# Patient Record
Sex: Male | Born: 1980 | Race: Black or African American | Hispanic: No | Marital: Married | Smoking: Never smoker
Health system: Southern US, Community
[De-identification: ages and names within clinical notes are randomized; demographics above are authoritative.]

## PROBLEM LIST (undated history)

## (undated) ENCOUNTER — Emergency Department (HOSPITAL_COMMUNITY): Admission: EM | Payer: BC Managed Care – PPO | Source: Home / Self Care

## (undated) DIAGNOSIS — J45909 Unspecified asthma, uncomplicated: Secondary | ICD-10-CM

## (undated) HISTORY — PX: THUMB ARTHROSCOPY: SHX2509

---

## 2006-05-21 ENCOUNTER — Emergency Department (HOSPITAL_COMMUNITY): Admission: EM | Admit: 2006-05-21 | Discharge: 2006-05-21 | Payer: Self-pay | Admitting: Internal Medicine

## 2007-07-17 ENCOUNTER — Emergency Department (HOSPITAL_COMMUNITY): Admission: EM | Admit: 2007-07-17 | Discharge: 2007-07-17 | Payer: Self-pay | Admitting: Emergency Medicine

## 2007-07-24 ENCOUNTER — Inpatient Hospital Stay (HOSPITAL_COMMUNITY): Admission: EM | Admit: 2007-07-24 | Discharge: 2007-07-26 | Payer: Self-pay | Admitting: Emergency Medicine

## 2008-08-22 ENCOUNTER — Emergency Department (HOSPITAL_COMMUNITY): Admission: EM | Admit: 2008-08-22 | Discharge: 2008-08-22 | Payer: Self-pay | Admitting: Emergency Medicine

## 2009-06-22 ENCOUNTER — Emergency Department (HOSPITAL_COMMUNITY): Admission: EM | Admit: 2009-06-22 | Discharge: 2009-06-22 | Payer: Self-pay | Admitting: Family Medicine

## 2010-01-12 ENCOUNTER — Ambulatory Visit: Payer: Self-pay | Admitting: Internal Medicine

## 2010-01-12 DIAGNOSIS — G4700 Insomnia, unspecified: Secondary | ICD-10-CM

## 2010-01-12 DIAGNOSIS — G4730 Sleep apnea, unspecified: Secondary | ICD-10-CM | POA: Insufficient documentation

## 2010-01-12 LAB — CONVERTED CEMR LAB
AST: 22 units/L (ref 0–37)
BUN: 13 mg/dL (ref 6–23)
Basophils Relative: 0.5 % (ref 0.0–3.0)
CO2: 28 meq/L (ref 19–32)
Chloride: 106 meq/L (ref 96–112)
Eosinophils Absolute: 0.3 10*3/uL (ref 0.0–0.7)
GFR calc non Af Amer: 95.2 mL/min (ref >60)
Lymphs Abs: 1.3 10*3/uL (ref 0.7–4.0)
MCV: 86.2 fL (ref 78.0–100.0)
Monocytes Absolute: 0.5 10*3/uL (ref 0.1–1.0)
Monocytes Relative: 8.9 % (ref 3.0–12.0)
Neutro Abs: 3.2 10*3/uL (ref 1.4–7.7)
Neutrophils Relative %: 60.3 % (ref 43.0–77.0)
Potassium: 4.1 meq/L (ref 3.5–5.1)
Sodium: 141 meq/L (ref 135–145)
Total Protein: 6.9 g/dL (ref 6.0–8.3)
WBC: 5.3 10*3/uL (ref 4.5–10.5)

## 2010-01-17 ENCOUNTER — Encounter (INDEPENDENT_AMBULATORY_CARE_PROVIDER_SITE_OTHER): Payer: Self-pay | Admitting: *Deleted

## 2010-02-05 ENCOUNTER — Telehealth: Payer: Self-pay | Admitting: Pulmonary Disease

## 2010-02-09 ENCOUNTER — Encounter: Payer: Self-pay | Admitting: Internal Medicine

## 2010-02-09 ENCOUNTER — Ambulatory Visit: Payer: Self-pay | Admitting: Internal Medicine

## 2010-02-09 LAB — CONVERTED CEMR LAB
Cholesterol: 173 mg/dL (ref 0–200)
HDL: 38.4 mg/dL — ABNORMAL LOW (ref >39.00)
LDL Cholesterol: 112 mg/dL — ABNORMAL HIGH (ref 0–99)
Total CHOL/HDL Ratio: 5

## 2010-08-14 NOTE — Assessment & Plan Note (Signed)
Summary: PHYSICAL---STC   Vital Signs:  Patient profile:   30 year old male Height:      69 inches Weight:      285 pounds O2 Sat:      96 % Temp:     98.4 degrees F oral Pulse rate:   80 / minute Pulse rhythm:   regular Resp:     16 per minute BP sitting:   112 / 70  (left arm)  Vitals Entered By: Rock Nephew CMA (February 09, 2010 1:20 PM)  Primary Care Provider:  Etta Grandchild MD   History of Present Illness: He returns for a complete physical with no complaints.  Preventive Screening-Counseling & Management  Alcohol-Tobacco     Alcohol drinks/day: <1     Alcohol type: beer     >5/day in last 3 mos: no     Alcohol Counseling: not indicated; use of alcohol is not excessive or problematic     Feels need to cut down: no     Feels annoyed by complaints: no     Feels guilty re: drinking: no     Needs 'eye opener' in am: no     Smoking Status: never  Caffeine-Diet-Exercise     Does Patient Exercise: no     Exercise Counseling: to improve exercise regimen  Hep-HIV-STD-Contraception     Hepatitis Risk: no risk noted     HIV Risk: no risk noted     STD Risk: no risk noted  Safety-Violence-Falls     Seat Belt Use: yes     Helmet Use: no     Firearms in the Home: no firearms in the home     Smoke Detectors: no     Violence in the Home: no risk noted     Sexual Abuse: no      Sexual History:  currently monogamous.        Drug Use:  no.        Blood Transfusions:  no.    Clinical Review Panels:  Diabetes Management   Creatinine:  1.2 (01/12/2010)  CBC   WBC:  5.3 (01/12/2010)   RBC:  4.88 (01/12/2010)   Hgb:  14.2 (01/12/2010)   Hct:  42.0 (01/12/2010)   Platelets:  206.0 (01/12/2010)   MCV  86.2 (01/12/2010)   MCHC  33.8 (01/12/2010)   RDW  13.9 (01/12/2010)   PMN:  60.3 (01/12/2010)   Lymphs:  24.7 (01/12/2010)   Monos:  8.9 (01/12/2010)   Eosinophils:  5.6 (01/12/2010)   Basophil:  0.5 (01/12/2010)  Complete Metabolic Panel   Glucose:  86  (01/12/2010)   Sodium:  141 (01/12/2010)   Potassium:  4.1 (01/12/2010)   Chloride:  106 (01/12/2010)   CO2:  28 (01/12/2010)   BUN:  13 (01/12/2010)   Creatinine:  1.2 (01/12/2010)   Albumin:  3.8 (01/12/2010)   Total Protein:  6.9 (01/12/2010)   Calcium:  9.1 (01/12/2010)   Total Bili:  0.7 (01/12/2010)   Alk Phos:  53 (01/12/2010)   SGPT (ALT):  20 (01/12/2010)   SGOT (AST):  22 (01/12/2010)   Medications Prior to Update: 1)  Silenor 6 Mg Tabs (Doxepin Hcl) .... One By Mouth At Bedtime As Needed For Insomnia  Current Medications (verified): 1)  Silenor 6 Mg Tabs (Doxepin Hcl) .... One By Mouth At Bedtime As Needed For Insomnia  Allergies (verified): No Known Drug Allergies  Past History:  Past Medical History: Last updated: 01/12/2010 Unremarkable  Past Surgical History:  Last updated: 01/12/2010 left thumb was surgically repaired in 2009 after a degloving injury at work- he thinks this is when insomnia started  Family History: Last updated: 01/12/2010 Family History of Arthritis Family History Diabetes 1st degree relative  Social History: Last updated: 01/12/2010 Occupation: welder Married Never Smoked Alcohol use-yes Drug use-no Regular exercise-no  Risk Factors: Alcohol Use: <1 (02/09/2010) >5 drinks/d w/in last 3 months: no (02/09/2010) Exercise: no (02/09/2010)  Risk Factors: Smoking Status: never (02/09/2010)  Family History: Reviewed history from 01/12/2010 and no changes required. Family History of Arthritis Family History Diabetes 1st degree relative  Social History: Reviewed history from 01/12/2010 and no changes required. Occupation: welder Married Never Smoked Alcohol use-yes Drug use-no Regular exercise-no Risk analyst Use:  yes  Review of Systems  The patient denies anorexia, fever, weight loss, weight gain, chest pain, syncope, dyspnea on exertion, peripheral edema, prolonged cough, headaches, hemoptysis, abdominal pain,  hematuria, suspicious skin lesions, enlarged lymph nodes, angioedema, and testicular masses.    Physical Exam  General:  alert, well-developed, well-nourished, well-hydrated, appropriate dress, normal appearance, healthy-appearing, cooperative to examination, good hygiene, and overweight-appearing.   Head:  normocephalic, atraumatic, no abnormalities observed, and no abnormalities palpated.   Mouth:  Oral mucosa and oropharynx without lesions or exudates.  Teeth in good repair. Neck:  supple, full ROM, no masses, no thyromegaly, no thyroid nodules or tenderness, no JVD, normal carotid upstroke, no carotid bruits, no cervical lymphadenopathy, and no neck tenderness.   Lungs:  normal respiratory effort, no intercostal retractions, no accessory muscle use, normal breath sounds, no dullness, no fremitus, no crackles, and no wheezes.   Heart:  normal rate, regular rhythm, no murmur, no gallop, no rub, and no JVD.   Abdomen:  soft, non-tender, normal bowel sounds, no distention, no masses, no guarding, no rigidity, no rebound tenderness, no abdominal hernia, no hepatomegaly, and no splenomegaly.   Genitalia:  circumcised, no hydrocele, no varicocele, no scrotal masses, no testicular masses or atrophy, no cutaneous lesions, and no urethral discharge.   Msk:  normal ROM, no joint tenderness, no joint swelling, no joint warmth, no redness over joints, no joint deformities, no joint instability, no crepitation, and no muscle atrophy.   Pulses:  R and L carotid,radial,femoral,dorsalis pedis and posterior tibial pulses are full and equal bilaterally Extremities:  No clubbing, cyanosis, edema, or deformity noted with normal full range of motion of all joints.   Neurologic:  No cranial nerve deficits noted. Station and gait are normal. Plantar reflexes are down-going bilaterally. DTRs are symmetrical throughout. Sensory, motor and coordinative functions appear intact. Skin:  Intact without suspicious lesions or  rashes and tattoo(s).   Cervical Nodes:  no anterior cervical adenopathy and no posterior cervical adenopathy.   Axillary Nodes:  no R axillary adenopathy and no L axillary adenopathy.   Inguinal Nodes:  no R inguinal adenopathy and no L inguinal adenopathy.   Psych:  Cognition and judgment appear intact. Alert and cooperative with normal attention span and concentration. No apparent delusions, illusions, hallucinations   Impression & Recommendations:  Problem # 1:  ROUTINE GENERAL MEDICAL EXAM@HEALTH  CARE FACL (ICD-V70.0) Assessment New  Orders: Venipuncture (16109) TLB-Lipid Panel (80061-LIPID) EKG w/ Interpretation (93000)  Td Booster: Historical (07/16/2007)   TSH: 3.56 (01/12/2010)    Discussed using sunscreen, use of alcohol, drug use, self testicular exam, routine dental care, routine eye care, routine physical exam, seat belts, multiple vitamins, and recommendations for immunizations.  Discussed exercise and checking cholesterol.  Discussed gun  safety, safe sex, and contraception.   Complete Medication List: 1)  Silenor 6 Mg Tabs (Doxepin hcl) .... One by mouth at bedtime as needed for insomnia  Patient Instructions: 1)  Please schedule a follow-up appointment as needed. 2)  It is important that you exercise regularly at least 20 minutes 5 times a week. If you develop chest pain, have severe difficulty breathing, or feel very tired , stop exercising immediately and seek medical attention. 3)  You need to lose weight. Consider a lower calorie diet and regular exercise.  4)  If you could be exposed to sexually transmitted diseases, you should use a condom.

## 2010-08-14 NOTE — Letter (Signed)
Summary: Lipid Letter  [facility]  [address] [place], [place] [address]   Phone: [phone]  Fax: [phone]    02/09/2010  Charles Hoffman [address] [place], [place]  [address]  Dear Charles Hoffman:  We have carefully reviewed your last lipid profile from 02/09/2010 and the results are noted below with a summary of recommendations for lipid management.    Cholesterol:       173     Goal: <200   HDL "good" Cholesterol:   27.25     Goal: >40   LDL "bad" Cholesterol:   112     Goal: <130   Triglycerides:       114.0     Goal: <150        TLC Diet (Therapeutic Lifestyle Change): Saturated Fats & Transfatty acids should be kept < 7% of total calories ***Reduce Saturated Fats Polyunstaurated Fat can be up to 10% of total calories Monounsaturated Fat Fat can be up to 20% of total calories Total Fat should be no greater than 25-35% of total calories Carbohydrates should be 50-60% of total calories Protein should be approximately 15% of total calories Fiber should be at least 20-30 grams a day ***Increased fiber may help lower LDL Total Cholesterol should be < 200mg /day Consider adding plant stanol/sterols to diet (example: Benacol spread) ***A higher intake of unsaturated fat may reduce Triglycerides and Increase HDL    Adjunctive Measures (may lower LIPIDS and reduce risk of Heart Attack) include: Aerobic Exercise (20-30 minutes 3-4 times a week) Limit Alcohol Consumption Weight Reduction Aspirin 75-81 mg a day by mouth (if not allergic or contraindicated) Dietary Fiber 20-30 grams a day by mouth     Current Medications: 1)    Silenor 6 Mg Tabs (Doxepin hcl) .... One by mouth at bedtime as needed for insomnia  If you have any questions, please call. We appreciate being able to work with you.   Sincerely,    [facility] Charles Grandchild MD

## 2010-08-14 NOTE — Letter (Signed)
Summary: [facility] Consult Scheduled Letter  [facility]  [address] [place], [place] [address]   Phone: [phone]  Fax: [phone]      01/17/2010 MRN: [id number]  Charles Hoffman [address] [place], [place]  [address]    Dear [name],      We have scheduled an appointment for you.  At the recommendation of Dr.Jones we have scheduled you a consult with ([facility]) DR.Vassie Loll on July 22,2011 at 3:30PM.Their phone number is [phone].If this appointment day and time is not convenient for you, please feel free to call the office of the doctor you are being referred to at the number listed above and reschedule the appointment.    Thank you,  Patient Care Coordinator [facility]

## 2010-08-14 NOTE — Progress Notes (Signed)
Summary: nos appt  Phone Note Call from Patient   Caller: [email or web address]  Call For: alva Summary of Call: ATC pt to rsc nos from 7/22, non working number. Initial call taken by: Darletta Moll,  February 05, 2010 9:52 AM

## 2010-08-14 NOTE — Assessment & Plan Note (Signed)
Summary: new / bcbs/cd   Vital Signs:  Patient profile:   30 year old male Height:      69 inches (175.26 cm) Weight:      288 pounds (130.91 kg) BMI:     42.68 O2 Sat:      98 % on Room air Temp:     98.1 degrees F (36.72 degrees C) oral Pulse rate:   74 / minute Pulse rhythm:   regular Resp:     16 per minute BP sitting:   100 / 78  (left arm) Cuff size:   large  Vitals Entered By: Lanier Prude, CMA(AAMA) (January 12, 2010 10:52 AM)  Nutrition Counseling: Patient's BMI is greater than 25 and therefore counseled on weight management options.  O2 Flow:  Room air CC: New pt to est PCP. C/O insomnia, Insomnia Is Patient Diabetic? No Pain Assessment Patient in pain? no        Primary Care Provider:  Etta Grandchild MD  CC:  New pt to est PCP. C/O insomnia and Insomnia.  History of Present Illness:  Insomnia      This is a 30 year old man who presents with Insomnia.  The symptoms began >1 year ago.  The severity is described as moderate.  The patient reports difficulty falling asleep, frequent awakening, early awakening, snoring, and apnea noted by partner, but denies nightmares, leg movements, and daytime somnolence.  Associated symptoms include weight gain.  Insomnia has led to problems with falling asleep at work, memory loss, and impaired judgement.  Risk factors for insomnia include obesity, working third shift, and irregular work schedule.  Behaviors that may contribute to insomnia include OTC sleep aids.  Treatments tried in the past and found to be ineffective incude sedatives.    Preventive Screening-Counseling & Management  Alcohol-Tobacco     Alcohol drinks/day: <1     Alcohol type: beer     >5/day in last 3 mos: no     Alcohol Counseling: not indicated; use of alcohol is not excessive or problematic     Feels need to cut down: no     Feels annoyed by complaints: no     Feels guilty re: drinking: no     Needs 'eye opener' in am: no     Smoking Status:  never  Caffeine-Diet-Exercise     Does Patient Exercise: no     Exercise Counseling: to improve exercise regimen  Hep-HIV-STD-Contraception     Hepatitis Risk: no risk noted     HIV Risk: no risk noted     STD Risk: no risk noted      Sexual History:  currently monogamous.        Drug Use:  no.        Blood Transfusions:  no.    Medications Prior to Update: 1)  None  Current Medications (verified): 1)  Silenor 6 Mg Tabs (Doxepin Hcl) .... 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  The patient denies anorexia, fever, chest pain, syncope, dyspnea on exertion, peripheral edema, prolonged cough, headaches, hemoptysis, abdominal pain, hematuria, difficulty walking, depression, enlarged lymph nodes, and angioedema.    Physical Exam  General:  alert, well-developed, well-nourished, well-hydrated, appropriate dress, normal appearance, healthy-appearing, cooperative to examination, good hygiene, and overweight-appearing.   Head:  normocephalic, atraumatic, no abnormalities observed, and no abnormalities palpated.   Eyes:  vision grossly intact and pupils equal.   Mouth:  Oral mucosa and oropharynx without lesions or exudates.  Teeth in good repair. Neck:  supple, full  ROM, no masses, no thyromegaly, no thyroid nodules or tenderness, no JVD, normal carotid upstroke, no carotid bruits, no cervical lymphadenopathy, and no neck tenderness.   Lungs:  normal respiratory effort, no intercostal retractions, no accessory muscle use, normal breath sounds, no dullness, no fremitus, no crackles, and no wheezes.   Heart:  normal rate, regular rhythm, no murmur, no gallop, no rub, and no JVD.   Abdomen:  soft, non-tender, normal bowel sounds, no distention, no masses, no guarding, no rigidity, no rebound tenderness, no abdominal hernia, no hepatomegaly, and no splenomegaly.   Msk:  No deformity or scoliosis noted of thoracic or lumbar spine.   Pulses:  R and L carotid,radial,femoral,dorsalis pedis and posterior tibial pulses are full and equal bilaterally Extremities:  No clubbing, cyanosis, edema, or deformity noted with normal full range of motion of all joints.   Neurologic:  No cranial nerve deficits noted. Station and gait are normal. Plantar reflexes are down-going bilaterally. DTRs are symmetrical throughout. Sensory, motor and coordinative functions appear intact. Skin:  Intact without suspicious lesions or rashes Cervical Nodes:  no anterior cervical adenopathy and no posterior cervical adenopathy.   Axillary Nodes:  no R axillary adenopathy and no L axillary adenopathy.   Psych:  Cognition and judgment appear intact. Alert and cooperative with normal attention span and concentration. No apparent delusions, illusions, hallucinations   Impression & Recommendations:  Problem # 1:  INSOMNIA-SLEEP DISORDER-UNSPEC (ICD-780.52) Assessment New  His updated medication list for this problem includes:    Silenor 6 Mg Tabs (Doxepin hcl) ..... One by mouth at bedtime as needed for insomnia  Orders: Venipuncture (81191) TLB-BMP (Basic Metabolic Panel-BMET) (80048-METABOL) TLB-CBC Platelet - w/Differential (85025-CBCD) TLB-Hepatic/Liver Function Pnl  (80076-HEPATIC) TLB-TSH (Thyroid Stimulating Hormone) (84443-TSH) Sleep Disorder Referral (Sleep Disorder)  Discussed sleep hygiene.   Problem # 2:  SLEEP APNEA (ICD-780.57) Assessment: New  Orders: Venipuncture (47829) TLB-BMP (Basic Metabolic Panel-BMET) (80048-METABOL) TLB-CBC Platelet - w/Differential (85025-CBCD) TLB-Hepatic/Liver Function Pnl (80076-HEPATIC) TLB-TSH (Thyroid Stimulating Hormone) (84443-TSH) Sleep Disorder Referral (Sleep Disorder)  Complete Medication List: 1)  Silenor 6 Mg Tabs (Doxepin hcl) .... One by mouth at bedtime as needed for insomnia  Patient Instructions: 1)  Please schedule a follow-up appointment in 2 months. 2)  It is important that you exercise regularly at least 20 minutes 5 times a week. If you develop chest pain, have severe difficulty breathing, or feel very tired , stop exercising immediately and seek medical attention. 3)  You need to lose weight. Consider a lower calorie diet and regular exercise.  Prescriptions: SILENOR 6 MG TABS (DOXEPIN HCL) One by mouth at bedtime as needed for insomnia  #40 x 0   Entered and Authorized by:   Charles Fus L.  Yetta Barre MD   Signed by:   Charles Grandchild MD on 01/12/2010   Method used:   Samples Given   RxID:   [id number]    Immunization History:  Tetanus/Td Immunization History:    Tetanus/Td:  historical (07/16/2007)

## 2010-09-07 IMAGING — CR DG CHEST 2V
2 series · 2 of 2 positions shown · non-contrast
Comparison: 08/22/2008

CLINICAL DATA: Cough.  Congestion.  Wheezing.

CHEST - 2 VIEW

[w chest pa]
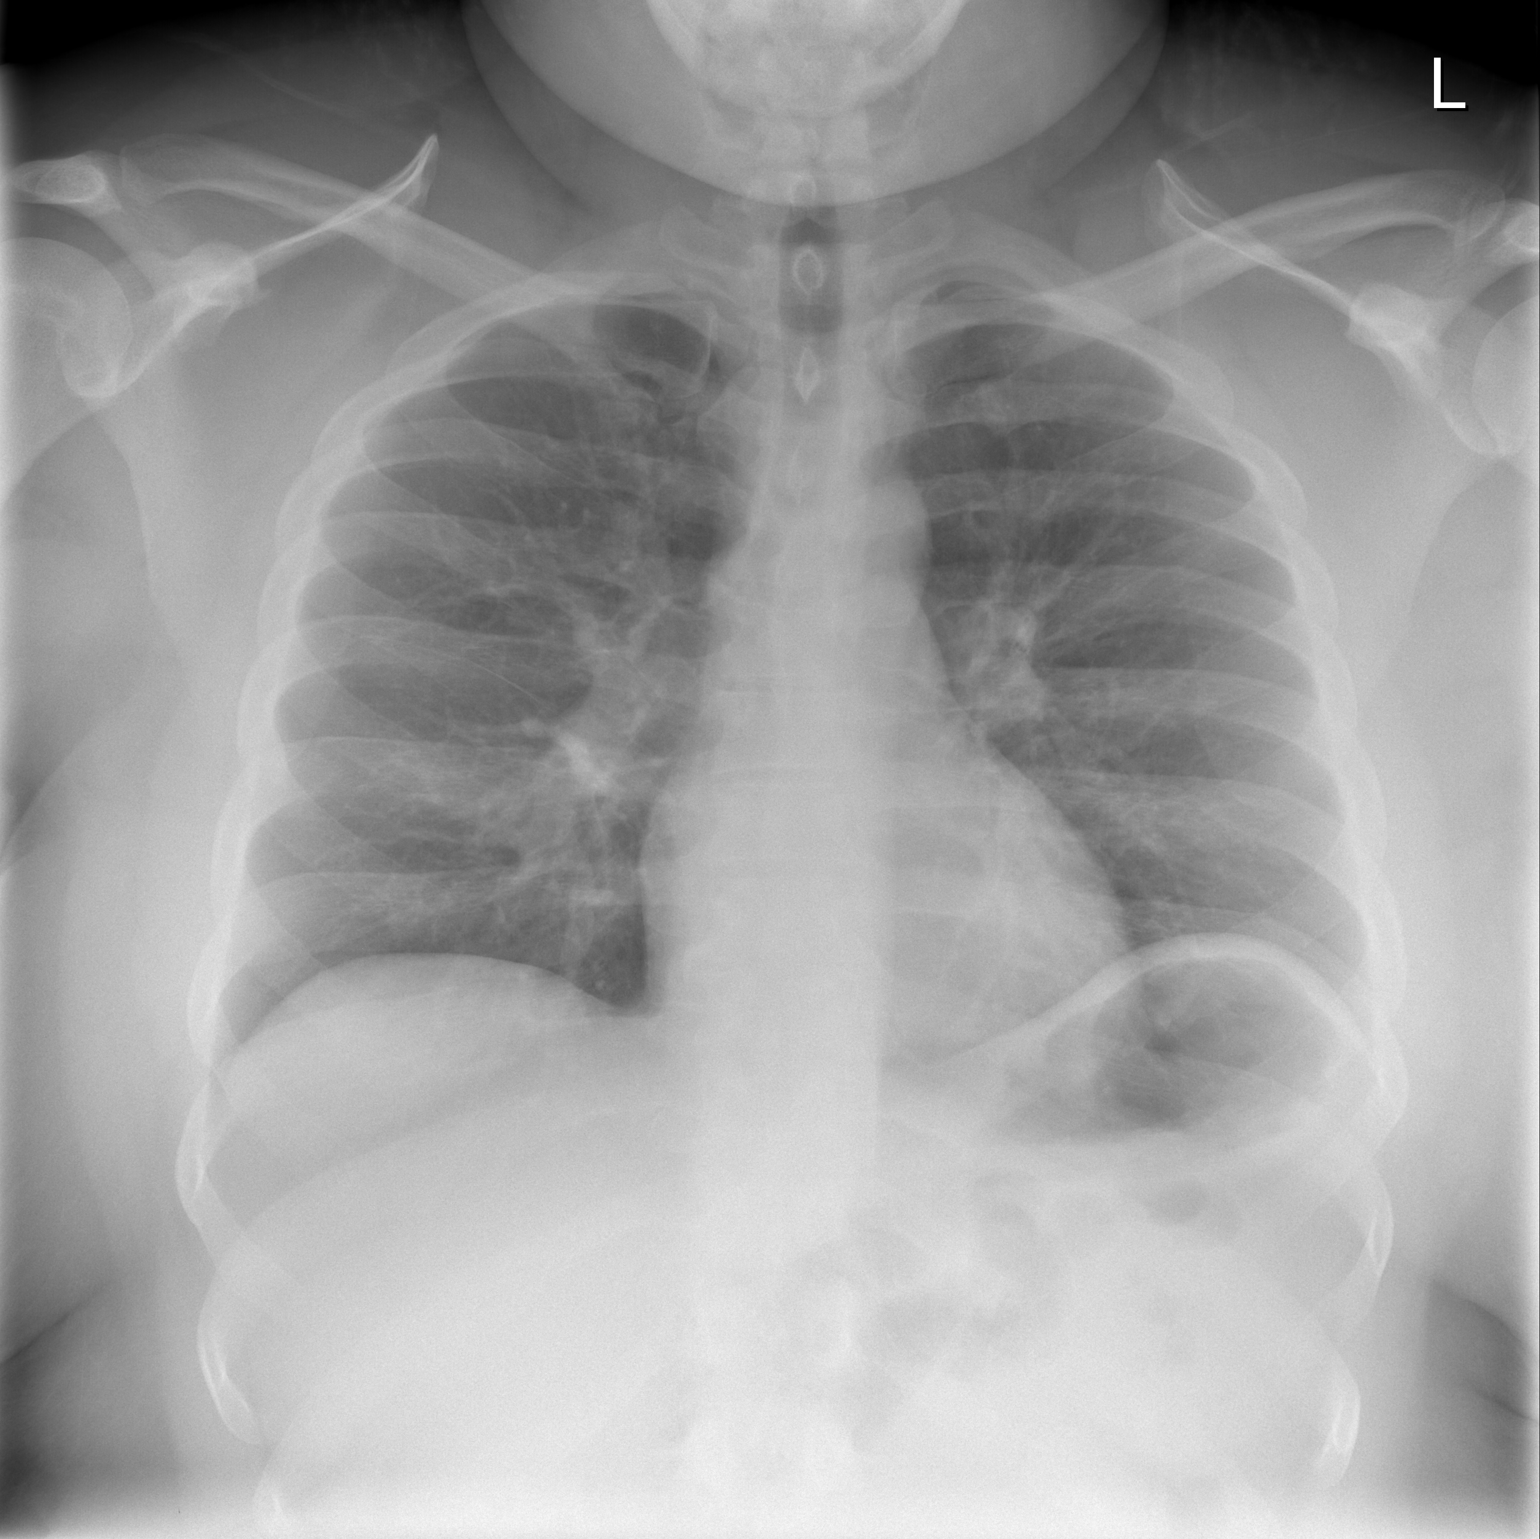

[w chest lat]
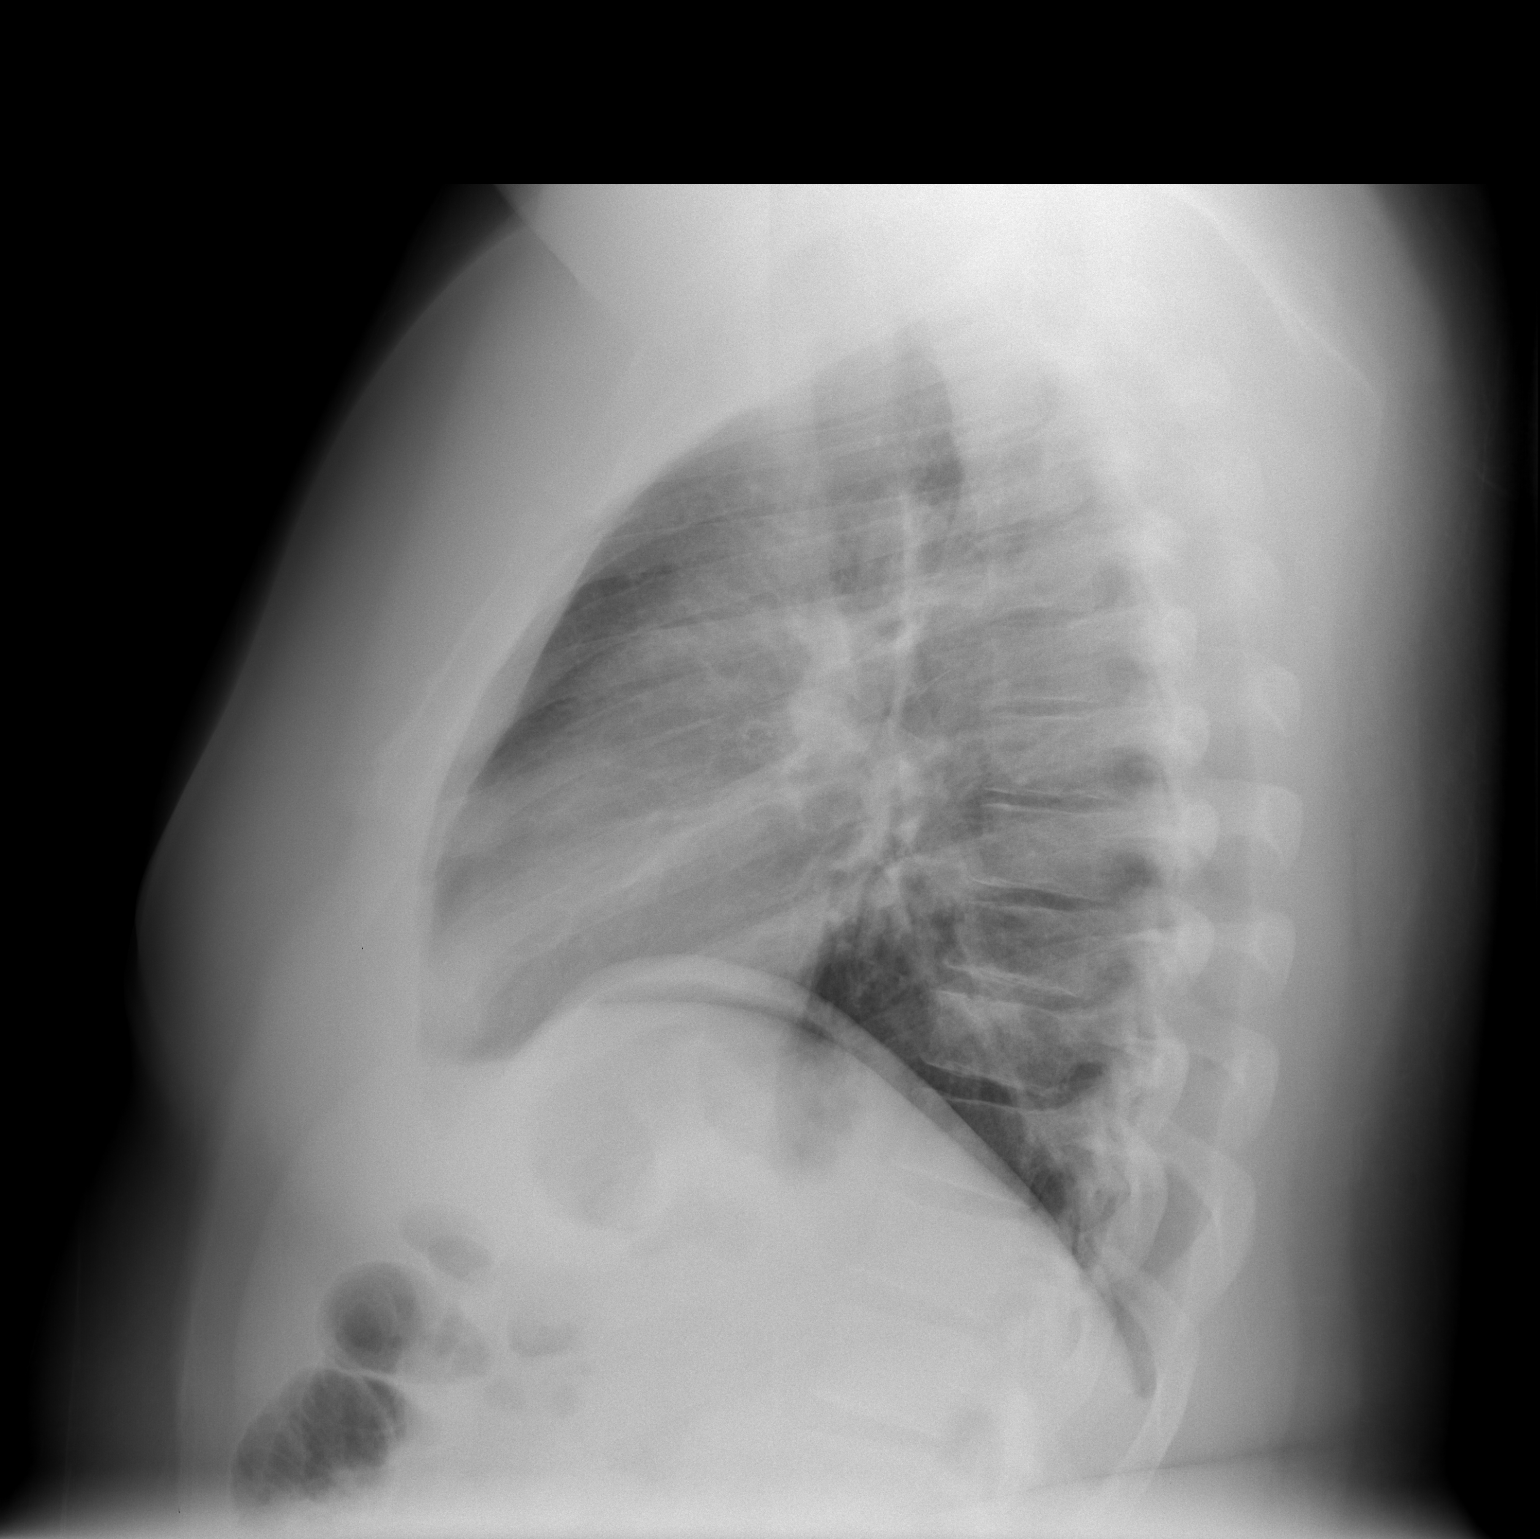

[2 of 2 positions shown; findings below may reference images not displayed]

FINDINGS: Peribronchial thickening as noted previously.  No active
infiltrate.  No atelectasis.  Normal cardiomediastinal silhouette.
Bony thorax intact.
IMPRESSION: Chronic bronchitic changes.  No infiltrate/atelectasis.

## 2010-11-27 NOTE — Op Note (Signed)
Charles Hoffman, Charles Hoffman                 ACCOUNT NO.:  [id number]   MEDICAL RECORD NO.:  [id number]          PATIENT TYPE:  INP   LOCATION:  1606                         FACILITY:  [facility]   PHYSICIAN:  Dionne Ano. Gramig III, M.D.DATE OF BIRTH:  1980-11-30   DATE OF PROCEDURE:  07/24/2007  DATE OF DISCHARGE:                               OPERATIVE REPORT   PREOPERATIVE DIAGNOSIS:  Severe avulsive/degloving injury left thumb  with distal phalanx fracture, nailbed laceration and disarray as well as  severe soft tissue injury involving the tendon, tendon sheath, and bony  structures, of course.   POSTOPERATIVE DIAGNOSIS:  Severe avulsive/degloving injury left thumb  with distal phalanx fracture, nailbed laceration and disarray as well as  severe soft tissue injury involving the tendon, tendon sheath, and bony  structures, of course.   PROCEDURE:  1. I&D of left thumb skin, subcutaneous tissue, bone, muscle, and      tendon tissue.  This was an excisional debridement.  2. Open reduction and internal fixation distal phalanx fracture left      thumb.  3. Neurolysis, extensive in nature radial digital nerve and ulnar      digital nerve left thumb.  Both of these nerves were extensively      evaluated and underwent a neurolysis.  4. Volar flap advancement left thumb.  5. Nail plate removal left thumb.  6. Nail bed repair left thumb.  7. Stress radiography left upper extremity.  8. Complex laceration 5 cm in length left hand.   SURGEON:  Dominica Severin   ASSISTANT:  Karie Chimera   COMPLICATIONS:  None.   ANESTHESIA:  General.   TOURNIQUET TIME:  0.   ESTIMATED BLOOD LOSS:  Minimal.   INDICATIONS FOR PROCEDURE:  This patient is a pleasant male who presents  with the above-mentioned diagnosis.  I have counseled him in regard to  risks and benefits of this surgery including risk of infection,  bleeding, anesthesia, damage to normal structures, and failure of the  surgery to accomplish  its intended goals of relieving symptoms and  restoring function.  With this in mind, he desires to proceed.  All  questions have been encouraged and answered preoperatively.   OPERATIVE PROCEDURE:  The patient seen by myself and anesthesia.  He was  counseled in regards to the viability issues about his thumb.  Arm was  marked.  Consent was signed.  Preoperative Ancef was given, and tetanus  shot was addressed in the emergency room.  He was taken to the operative  suite and underwent a smooth induction of general anesthesia and then  underwent a prep and drape about the left upper extremity.  He had a  severe mangling and degloving injury about the thumb with open distal  phalanx fracture, nail bed disarray, and lysed serratia.  This was  stellate laceration that began dorsoradially and swung ulnarly to the  web space.  I identified this at length.  I should note his flexor  tendon sheath and oblique pulley were completely disrupted in the area  of the wound with lots of mechanical  debris in the area.  He had an open  fracture, and this was an extension of the degloving injury ulnarly  which coursed along the ulnar margin of the thumb.  The distal phalanx  fracture was open.  I performed an I&D of skin, subcutaneous tissue,  tendon, paratenon tissue, muscle, and bone.  Greater than 4-5 liters of  saline were placed through the area, and meticulously we picked out the  debris and nonviable tissue.  This was done with the tourniquet  deflated.   Following this, we performed open reduction internal fixation of the  distal phalanx fracture by placing a Kirschner wire 0.035 through the  tip of the thumb.  I was able to achieve excellent fixation and anatomic  reduction.  This was clipped and bent upon itself.   Following this, we underwent neurolysis of the radial and ulnar digital  nerves that were encased in mechanical debris and nonviable tissue.  These were traced out and were stable.   The patient had a radial digital  nerve avulsive injury with segmental loss which was noted as well.  Following the neurolysis, attention was turned towards the thumb tip  once again where a nail plate removal had previously been accomplished  atraumatically with the Therapist, nutritional.  The nailbed at this time was  repaired with 6-0 chromic suture including the sterile and germinal  matrix.  This was a very stellate laceration and I would give the nail  growth a guarded yet somewhat variable prognosis in terms of regrowth  but would expect some degree of undulation in the nail itself.   Following this, I then performed a volar advancement flap about the  thumb.  The patient had a Moberg type advancement flap accomplished.  This was based upon the blood flow characteristics of the flaps in  general, and this was advanced slightly.  There were no complicating  features with the advancement, and it was sutured loosely with chromic  suture.   Following this, I performed complex hand repair along 5 cm in the web  space and the dorsal margin of thumb.  This was closed loosely with  chromic suture.   The patient tolerated this well.  At the conclusion of the procedure, he  did have refill to the tip of his thumb; however, I would give this a  somewhat guarded prognosis at this juncture.  He was warmed copiously  with saline of the warm variety in the operative suite.  In addition to  this, I will plan for precautions postoperatively including Dextran L&D  40, aspirin, and warming techniques.  I have discussed with the patient  and his family these issues the do's and don't's etc.   We will continue a very close and cautious eye on the patient.  He will  need precautions for quite some time and generally prolonged recovery.  This was a very significant mangling degloving injury and certainly  prognosis for the future is somewhat guarded at this juncture.  Hopefully, he will maintain vascular  integrity and viability to the  thumb will be maintained.  It was a pleasure to participate in his care,  and I look forward to participating in his operative recovery.           ______________________________  Dionne Ano. Everlene Other, M.D.    Nash Mantis  D:  07/24/2007  T:  07/25/2007  Job:  [id number]

## 2011-01-23 ENCOUNTER — Emergency Department (HOSPITAL_COMMUNITY)
Admission: EM | Admit: 2011-01-23 | Discharge: 2011-01-23 | Disposition: A | Discharge status: Home / Self Care | Payer: BC Managed Care – PPO | Attending: Emergency Medicine | Admitting: Emergency Medicine

## 2011-01-23 DIAGNOSIS — K029 Dental caries, unspecified: Secondary | ICD-10-CM | POA: Insufficient documentation

## 2011-01-23 DIAGNOSIS — K089 Disorder of teeth and supporting structures, unspecified: Secondary | ICD-10-CM | POA: Insufficient documentation

## 2011-04-04 LAB — COMPREHENSIVE METABOLIC PANEL
AST: 28
Albumin: 3.4 — ABNORMAL LOW
BUN: 5 — ABNORMAL LOW
CO2: 27
Chloride: 102
Creatinine, Ser: 1.25
Glucose, Bld: 120 — ABNORMAL HIGH
Sodium: 136
Total Protein: 6

## 2011-04-04 LAB — APTT: aPTT: 25

## 2011-04-04 LAB — PROTIME-INR
INR: 0.9
Prothrombin Time: 12.7

## 2011-04-04 LAB — DIFFERENTIAL
Basophils Absolute: 0
Basophils Relative: 1
Monocytes Relative: 8

## 2012-02-29 ENCOUNTER — Encounter (HOSPITAL_COMMUNITY): Payer: Self-pay | Admitting: Emergency Medicine

## 2012-02-29 ENCOUNTER — Emergency Department (HOSPITAL_COMMUNITY)
Admission: EM | Admit: 2012-02-29 | Discharge: 2012-02-29 | Disposition: A | Discharge status: Home / Self Care | Payer: Self-pay | Attending: Emergency Medicine | Admitting: Emergency Medicine

## 2012-02-29 DIAGNOSIS — R059 Cough, unspecified: Secondary | ICD-10-CM | POA: Insufficient documentation

## 2012-02-29 DIAGNOSIS — J40 Bronchitis, not specified as acute or chronic: Secondary | ICD-10-CM | POA: Insufficient documentation

## 2012-02-29 DIAGNOSIS — R05 Cough: Secondary | ICD-10-CM | POA: Insufficient documentation

## 2012-02-29 HISTORY — DX: Unspecified asthma, uncomplicated: J45.909

## 2012-02-29 MED ORDER — AZITHROMYCIN 250 MG PO TABS: ORAL_TABLET | ORAL | Status: DC

## 2012-02-29 NOTE — ED Provider Notes (Signed)
History     CSN: [id number]  Arrival date & time 02/29/12  4098   First MD Initiated Contact with Patient 02/29/12 [phone]      Chief Complaint  Patient presents with  . Cough    (Consider location/radiation/quality/duration/timing/severity/associated sxs/prior treatment) HPI  Patient reports for the past 1-1/2 weeks he's been having a cough with initially green sputum which is now white. Two days in the past week he was awakened from sleep with sweats. He states he has some low back pain like a pulling sensation when he coughs. Also states he is starting to lose his voice. He states at night he has wheezing and he has an inhaler that he uses for that. He states he has a sore throat when he coughs but he denies shortness of breath. He also has a feeling that he has postnasal drip. He states he's had this past 3 years he normally gets better with a Z-Pak.  PCP none  Past Medical History  Diagnosis Date  . Asthma     Past Surgical History  Procedure Date  . Thumb arthroscopy     History reviewed. No pertinent family history.  History  Substance Use Topics  . Smoking status: Never Smoker   . Smokeless tobacco: Not on file  . Alcohol Use: No  employed as a Psychologist, occupational Lives with wife  wife smokes  Review of Systems  All other systems reviewed and are negative.    Allergies  Review of patient's allergies indicates no known allergies.  Home Medications   Current Outpatient Rx  Name Route Sig Dispense Refill  . DIPHENHYDRAMINE HCL 25 MG PO CAPS Oral Take 25 mg by mouth every 6 (six) hours as needed. allergies      BP 132/67  Pulse 85  Temp 98.6 F (37 C) (Oral)  Resp 20  SpO2 97%  Vital signs normal    Physical Exam  Nursing note and vitals reviewed. Constitutional: He is oriented to person, place, and time. He appears well-developed and well-nourished.  Non-toxic appearance. He does not appear ill. No distress.  HENT:  Head: Normocephalic and atraumatic.    Right Ear: External ear normal.  Left Ear: External ear normal.  Nose: Nose normal. No mucosal edema or rhinorrhea.  Mouth/Throat: Oropharynx is clear and moist and mucous membranes are normal. No dental abscesses or uvula swelling.       Patient has mild laryngitis  Eyes: Conjunctivae and EOM are normal. Pupils are equal, round, and reactive to light.  Neck: Normal range of motion and full passive range of motion without pain. Neck supple.  Cardiovascular: Normal rate, regular rhythm and normal heart sounds.  Exam reveals no gallop and no friction rub.   No murmur heard. Pulmonary/Chest: Effort normal and breath sounds normal. No respiratory distress. He has no wheezes. He has no rhonchi. He has no rales. He exhibits no tenderness and no crepitus.       Patient's coughing and it sounds dry  Abdominal: Soft. Normal appearance and bowel sounds are normal. He exhibits no distension. There is no tenderness. There is no rebound and no guarding.  Musculoskeletal: Normal range of motion. He exhibits no edema and no tenderness.       Moves all extremities well.   Neurological: He is alert and oriented to person, place, and time. He has normal strength. No cranial nerve deficit.  Skin: Skin is warm, dry and intact. No rash noted. No erythema. No pallor.  Psychiatric: He has  a normal mood and affect. His speech is normal and behavior is normal. His mood appears not anxious.    ED Course  Procedures (including critical care time)  Patient states he thinks he only needs a Z-Pak. He did not want any other interventions or studies to be done. He states he doesn't need another inhaler.     1. Bronchitis    New Prescriptions   AZITHROMYCIN (ZITHROMAX Z-PAK) 250 MG TABLET    Take 2 po the first day then once a day for the next 4 days.   Plan discharge  Devoria Albe, MD, FACEP    MDM          Ward Givens, MD 02/29/12 [phone]

## 2012-02-29 NOTE — ED Notes (Signed)
Patient states that he has had a upper respiratory cough with cold symptoms x 1 week

## 2012-03-06 DIAGNOSIS — J45909 Unspecified asthma, uncomplicated: Secondary | ICD-10-CM | POA: Insufficient documentation

## 2012-03-06 DIAGNOSIS — K047 Periapical abscess without sinus: Secondary | ICD-10-CM | POA: Insufficient documentation

## 2012-03-07 ENCOUNTER — Emergency Department (HOSPITAL_COMMUNITY)
Admission: EM | Admit: 2012-03-07 | Discharge: 2012-03-07 | Disposition: A | Discharge status: Home / Self Care | Payer: Self-pay | Attending: Emergency Medicine | Admitting: Emergency Medicine

## 2012-03-07 ENCOUNTER — Encounter (HOSPITAL_COMMUNITY): Payer: Self-pay | Admitting: Emergency Medicine

## 2012-03-07 DIAGNOSIS — K047 Periapical abscess without sinus: Secondary | ICD-10-CM

## 2012-03-07 MED ORDER — PENICILLIN V POTASSIUM 500 MG PO TABS: 500.0000 mg | ORAL_TABLET | Freq: Four times a day (QID) | ORAL | Status: AC

## 2012-03-07 MED ORDER — BUPIVACAINE-EPINEPHRINE PF 0.5-1:200000 % IJ SOLN
INTRAMUSCULAR | Status: AC
  Filled 2012-03-07: qty 1.8

## 2012-03-07 MED ORDER — OXYCODONE-ACETAMINOPHEN 5-325 MG PO TABS: 1.0000 | ORAL_TABLET | ORAL | Status: AC | PRN

## 2012-03-07 MED ORDER — BUPIVACAINE HCL (PF) 0.5 % IJ SOLN
INTRAMUSCULAR | Status: AC
  Administered 2012-03-07: 02:00:00
  Filled 2012-03-07: qty 30

## 2012-03-07 NOTE — ED Notes (Signed)
PA at bedside.

## 2012-03-07 NOTE — ED Provider Notes (Signed)
History     CSN: [id number]  Arrival date & time 03/06/12  2302   First MD Initiated Contact with Patient 03/07/12 0200      Chief Complaint  Patient presents with  . Dental Pain    (Consider location/radiation/quality/duration/timing/severity/associated sxs/prior treatment) Patient is a 31 y.o. male presenting with tooth pain. The history is provided by the patient.  Dental PainThe primary symptoms include mouth pain. Primary symptoms do not include oral bleeding, headaches or fever. The symptoms began 3 to 5 days ago. The symptoms are worsening. The symptoms are new. The symptoms occur constantly.  Pt states tooth has been broken for few years. Pain and now swelling to the gum. Has not seen a dentist.  Past Medical History  Diagnosis Date  . Asthma     Past Surgical History  Procedure Date  . Thumb arthroscopy     Family History  Problem Relation Age of Onset  . Diabetes Other     History  Substance Use Topics  . Smoking status: Never Smoker   . Smokeless tobacco: Not on file  . Alcohol Use: No      Review of Systems  Constitutional: Negative for fever and chills.  HENT: Positive for dental problem.   Respiratory: Negative.   Cardiovascular: Negative.   Musculoskeletal: Negative.   Skin: Negative.   Neurological: Negative for headaches.  Hematological: Negative.   Psychiatric/Behavioral: Negative.     Allergies  Review of patient's allergies indicates no known allergies.  Home Medications   Current Outpatient Rx  Name Route Sig Dispense Refill  . DIPHENHYDRAMINE HCL 25 MG PO CAPS Oral Take 25 mg by mouth every 6 (six) hours as needed. allergies      BP 130/74  Pulse 79  Temp 98.8 F (37.1 C) (Oral)  Resp 18  SpO2 99%  Physical Exam  Nursing note and vitals reviewed. Constitutional: He is oriented to person, place, and time. He appears well-developed and well-nourished. No distress.  HENT:       Left lower bicuspid and 1st molar decayed to  the gum line. surrouding abscess, tender to palpation  Eyes: Conjunctivae are normal. Pupils are equal, round, and reactive to light.  Neck: Neck supple.  Cardiovascular: Normal rate, regular rhythm and normal heart sounds.   Pulmonary/Chest: Effort normal and breath sounds normal. No respiratory distress. He has no wheezes.  Musculoskeletal: Normal range of motion. He exhibits no edema.  Neurological: He is alert and oriented to person, place, and time.  Skin: Skin is warm and dry.  Psychiatric: He has a normal mood and affect.    ED Course  Procedures (including critical care time)  Pt with dental abscess and tooth decay. I used lower alveolar block to numb pt's tooth. I&D performed with moderate puss out. Will d/c home with close outpatient oral surgery follow up. Antibiotics and pain meds at home.    1. Dental abscess       MDM          Lottie Mussel, PA 03/07/12 [phone]

## 2012-03-07 NOTE — ED Notes (Signed)
Pt states he has an abscessed tooth on the bottom left   Pt states pain started about Wednesday

## 2012-03-12 NOTE — ED Provider Notes (Signed)
Medical screening examination/treatment/procedure(s) were performed by non-physician practitioner and as supervising physician I was immediately available for consultation/collaboration.   Miosha Behe M Percival Glasheen, DO 03/12/12 1548 

## 2013-07-28 ENCOUNTER — Encounter (HOSPITAL_COMMUNITY): Payer: Self-pay | Admitting: Emergency Medicine

## 2013-07-28 ENCOUNTER — Emergency Department (HOSPITAL_COMMUNITY)
Admission: EM | Admit: 2013-07-28 | Discharge: 2013-07-29 | Disposition: A | Discharge status: Home / Self Care | Payer: BC Managed Care – PPO | Attending: Emergency Medicine | Admitting: Emergency Medicine

## 2013-07-28 DIAGNOSIS — J45909 Unspecified asthma, uncomplicated: Secondary | ICD-10-CM | POA: Insufficient documentation

## 2013-07-28 DIAGNOSIS — K5289 Other specified noninfective gastroenteritis and colitis: Secondary | ICD-10-CM | POA: Insufficient documentation

## 2013-07-28 DIAGNOSIS — K529 Noninfective gastroenteritis and colitis, unspecified: Secondary | ICD-10-CM

## 2013-07-28 DIAGNOSIS — R197 Diarrhea, unspecified: Secondary | ICD-10-CM | POA: Insufficient documentation

## 2013-07-28 LAB — CBC WITH DIFFERENTIAL/PLATELET
BASOS ABS: 0 10*3/uL (ref 0.0–0.1)
BASOS PCT: 0 % (ref 0–1)
EOS ABS: 0.1 10*3/uL (ref 0.0–0.7)
EOS PCT: 3 % (ref 0–5)
HEMATOCRIT: 44.7 % (ref 39.0–52.0)
Hemoglobin: 15.5 g/dL (ref 13.0–17.0)
LYMPHS ABS: 0.9 10*3/uL (ref 0.7–4.0)
LYMPHS PCT: 23 % (ref 12–46)
MCH: 29.3 pg (ref 26.0–34.0)
MCHC: 34.7 g/dL (ref 30.0–36.0)
MCV: 84.5 fL (ref 78.0–100.0)
Monocytes Absolute: 0.3 10*3/uL (ref 0.1–1.0)
Monocytes Relative: 8 % (ref 3–12)
NEUTROS ABS: 2.7 10*3/uL (ref 1.7–7.7)
Neutrophils Relative %: 66 % (ref 43–77)
PLATELETS: 192 10*3/uL (ref 150–400)
RBC: 5.29 MIL/uL (ref 4.22–5.81)
RDW: 13.1 % (ref 11.5–15.5)
WBC: 4.1 10*3/uL (ref 4.0–10.5)

## 2013-07-28 LAB — COMPREHENSIVE METABOLIC PANEL
ALBUMIN: 3.7 g/dL (ref 3.5–5.2)
ALT: 20 U/L (ref 0–53)
AST: 25 U/L (ref 0–37)
Alkaline Phosphatase: 55 U/L (ref 39–117)
BUN: 13 mg/dL (ref 6–23)
CHLORIDE: 102 meq/L (ref 96–112)
CO2: 24 meq/L (ref 19–32)
Calcium: 8.7 mg/dL (ref 8.4–10.5)
Creatinine, Ser: 1.24 mg/dL (ref 0.50–1.35)
GFR calc Af Amer: 86 mL/min — ABNORMAL LOW (ref >90)
GFR calc non Af Amer: 75 mL/min — ABNORMAL LOW (ref >90)
GLUCOSE: 98 mg/dL (ref 70–99)
Potassium: 4.1 mEq/L (ref 3.7–5.3)
SODIUM: 137 meq/L (ref 137–147)
Total Bilirubin: 0.4 mg/dL (ref 0.3–1.2)
Total Protein: 7.5 g/dL (ref 6.0–8.3)

## 2013-07-28 MED ORDER — ONDANSETRON 8 MG PO TBDP
8.0000 mg | ORAL_TABLET | Freq: Once | ORAL | Status: AC
  Administered 2013-07-28: 8 mg via ORAL
  Filled 2013-07-28: qty 1

## 2013-07-28 MED ORDER — DICYCLOMINE HCL 20 MG PO TABS
20.0000 mg | ORAL_TABLET | Freq: Once | ORAL | Status: AC
  Administered 2013-07-28: 20 mg via ORAL
  Filled 2013-07-28: qty 1

## 2013-07-28 NOTE — ED Notes (Signed)
Pt states he woke up Monday with abd pain  Pt states he has not been able to eat for the past couple days  Pt states he has had diarrhea since then and it has progressively gotten darker in color and now it looks black  Pt states he has been feeling weak  Denies vomiting

## 2013-07-28 NOTE — ED Provider Notes (Signed)
CSN: [id number]     Arrival date & time 07/28/13  2241 History   First MD Initiated Contact with Patient 07/28/13 2301     Chief Complaint  Patient presents with  . Abdominal Pain   (Consider location/radiation/quality/duration/timing/severity/associated sxs/prior Treatment) HPI 33 year old male presents to emergency room with complaint of nausea, abdominal discomfort, diarrhea.  Symptoms started 3 days ago.  No fever, no vomiting.  He's been taking Pepto-Bismol without improvement in symptoms.  No specific pain, or of a general cramping, and discomfort.  No prior abdominal surgeries.  No significant medical history.  No unusual foods, no travel, no sick contacts. Past Medical History  Diagnosis Date  . Asthma    Past Surgical History  Procedure Laterality Date  . Thumb arthroscopy     Family History  Problem Relation Age of Onset  . Diabetes Other    History  Substance Use Topics  . Smoking status: Never Smoker   . Smokeless tobacco: Not on file  . Alcohol Use: No    Review of Systems  See History of Present Illness; otherwise all other systems are reviewed and negative Allergies  Review of patient's allergies indicates no known allergies.  Home Medications   Current Outpatient Rx  Name  Route  Sig  Dispense  Refill  . bismuth subsalicylate (PEPTO BISMOL) 262 MG/15ML suspension   Oral   Take 30 mLs by mouth every 6 (six) hours as needed (indigestion).          BP 127/77  Pulse 78  Temp(Src) 98.8 F (37.1 C) (Oral)  Resp 15  Ht 5\' 7"  (1.702 m)  Wt 280 lb (127.007 kg)  BMI 43.84 kg/m2  SpO2 97% Physical Exam  Nursing note and vitals reviewed. Constitutional: He is oriented to person, place, and time. He appears well-developed and well-nourished.  HENT:  Head: Normocephalic and atraumatic.  Right Ear: External ear normal.  Left Ear: External ear normal.  Nose: Nose normal.  Mouth/Throat: Oropharynx is clear and moist.  Eyes: Conjunctivae and EOM are  normal. Pupils are equal, round, and reactive to light.  Neck: Normal range of motion. Neck supple. No JVD present. No tracheal deviation present. No thyromegaly present.  Cardiovascular: Normal rate, regular rhythm, normal heart sounds and intact distal pulses.  Exam reveals no gallop and no friction rub.   No murmur heard. Pulmonary/Chest: Effort normal and breath sounds normal. No stridor. No respiratory distress. He has no wheezes. He has no rales. He exhibits no tenderness.  Abdominal: Soft. He exhibits no distension and no mass. There is no tenderness. There is no rebound and no guarding.  Hyperactive bowel sounds  Musculoskeletal: Normal range of motion. He exhibits no edema and no tenderness.  Lymphadenopathy:    He has no cervical adenopathy.  Neurological: He is alert and oriented to person, place, and time. He exhibits normal muscle tone. Coordination normal.  Skin: Skin is warm and dry. No rash noted. No erythema. No pallor.  Psychiatric: He has a normal mood and affect. His behavior is normal. Judgment and thought content normal.    ED Course  Procedures (including critical care time) Labs Review Labs Reviewed  COMPREHENSIVE METABOLIC PANEL - Abnormal; Notable for the following:    GFR calc non Af Amer 75 (*)    GFR calc Af Amer 86 (*)    All other components within normal limits  URINALYSIS, ROUTINE W REFLEX MICROSCOPIC - Abnormal; Notable for the following:    Color, Urine AMBER (*)  Bilirubin Urine SMALL (*)    All other components within normal limits  CBC WITH DIFFERENTIAL  OCCULT BLOOD, POC DEVICE   Imaging Review No results found.  EKG Interpretation   None       MDM   1. Gastroenteritis    33 year old male with 3 days of abdominal discomfort, diarrhea, nausea.  Plan for Zofran, Bentyl.  We'll check labs.  12:58 AM Pt feeling better, has tolerated PO fluids.  Labs unremarkable.  Suspect gastroenteritis.    Olivia Mackie, MD 07/29/13 0100

## 2013-07-28 NOTE — ED Notes (Signed)
Pt is aware of the need for urine, Urine cup at bedside.

## 2013-07-29 LAB — URINALYSIS, ROUTINE W REFLEX MICROSCOPIC
Glucose, UA: NEGATIVE mg/dL
HGB URINE DIPSTICK: NEGATIVE
KETONES UR: NEGATIVE mg/dL
Leukocytes, UA: NEGATIVE
NITRITE: NEGATIVE
PH: 5.5 (ref 5.0–8.0)
PROTEIN: NEGATIVE mg/dL
SPECIFIC GRAVITY, URINE: 1.03 (ref 1.005–1.030)
UROBILINOGEN UA: 0.2 mg/dL (ref 0.0–1.0)

## 2013-07-29 LAB — OCCULT BLOOD, POC DEVICE: Fecal Occult Bld: NEGATIVE

## 2013-07-29 MED ORDER — ONDANSETRON 8 MG PO TBDP: 8.0000 mg | ORAL_TABLET | Freq: Three times a day (TID) | ORAL | Status: DC | PRN

## 2013-07-29 MED ORDER — DICYCLOMINE HCL 20 MG PO TABS: 20.0000 mg | ORAL_TABLET | Freq: Four times a day (QID) | ORAL | Status: DC | PRN

## 2013-07-29 NOTE — Discharge Instructions (Signed)
Diet for Diarrhea, Adult °Frequent, runny stools (diarrhea) may be caused or worsened by food or drink. Diarrhea may be relieved by changing your diet. Since diarrhea can last up to 7 days, it is easy for you to lose too much fluid from the body and become dehydrated. Fluids that are lost need to be replaced. Along with a modified diet, make sure you drink enough fluids to keep your urine clear or pale yellow. °DIET INSTRUCTIONS °· Ensure adequate fluid intake (hydration): have 1 cup (8 oz) of fluid for each diarrhea episode. Avoid fluids that contain simple sugars or sports drinks, fruit juices, whole milk products, and sodas. Your urine should be clear or pale yellow if you are drinking enough fluids. Hydrate with an oral rehydration solution that you can purchase at pharmacies, retail stores, and online. You can prepare an oral rehydration solution at home by mixing the following ingredients together: °·   tsp table salt. °· ¾ tsp baking soda. °·  tsp salt substitute containing potassium chloride. °· 1  tablespoons sugar. °· 1 L (34 oz) of water. °· Certain foods and beverages may increase the speed at which food moves through the gastrointestinal (GI) tract. These foods and beverages should be avoided and include: °· Caffeinated and alcoholic beverages. °· High-fiber foods, such as raw fruits and vegetables, nuts, seeds, and whole grain breads and cereals. °· Foods and beverages sweetened with sugar alcohols, such as xylitol, sorbitol, and mannitol. °· Some foods may be well tolerated and may help thicken stool including: °· Starchy foods, such as rice, toast, pasta, low-sugar cereal, oatmeal, grits, baked potatoes, crackers, and bagels.   °· Bananas.   °· Applesauce. °· Add probiotic-rich foods to help increase healthy bacteria in the GI tract, such as yogurt and fermented milk products. °RECOMMENDED FOODS AND BEVERAGES °Starches °Choose foods with less than 2 g of fiber per serving. °· Recommended:  White,  French, and pita breads, plain rolls, buns, bagels. Plain muffins, matzo. Soda, saltine, or graham crackers. Pretzels, melba toast, zwieback. Cooked cereals made with water: cornmeal, farina, cream cereals. Dry cereals: refined corn, wheat, rice. Potatoes prepared any way without skins, refined macaroni, spaghetti, noodles, refined rice. °· Avoid:  Bread, rolls, or crackers made with whole wheat, multi-grains, rye, bran seeds, nuts, or coconut. Corn tortillas or taco shells. Cereals containing whole grains, multi-grains, bran, coconut, nuts, raisins. Cooked or dry oatmeal. Coarse wheat cereals, granola. Cereals advertised as "high-fiber." Potato skins. Whole grain pasta, wild or brown rice. Popcorn. Sweet potatoes, yams. Sweet rolls, doughnuts, waffles, pancakes, sweet breads. °Vegetables °· Recommended: Strained tomato and vegetable juices. Most well-cooked and canned vegetables without seeds. Fresh: Tender lettuce, cucumber without the skin, cabbage, spinach, bean sprouts. °· Avoid: Fresh, cooked, or canned: Artichokes, baked beans, beet greens, broccoli, Brussels sprouts, corn, kale, legumes, peas, sweet potatoes. Cooked: Green or red cabbage, spinach. Avoid large servings of any vegetables because vegetables shrink when cooked, and they contain more fiber per serving than fresh vegetables. °Fruit °· Recommended: Cooked or canned: Apricots, applesauce, cantaloupe, cherries, fruit cocktail, grapefruit, grapes, kiwi, mandarin oranges, peaches, pears, plums, watermelon. Fresh: Apples without skin, ripe banana, grapes, cantaloupe, cherries, grapefruit, peaches, oranges, plums. Keep servings limited to ½ cup or 1 piece. °· Avoid: Fresh: Apples with skin, apricots, mangoes, pears, raspberries, strawberries. Prune juice, stewed or dried prunes. Dried fruits, raisins, dates. Large servings of all fresh fruits. °Protein °· Recommended: Ground or well-cooked tender beef, ham, veal, lamb, pork, or poultry. Eggs. Fish,  oysters, shrimp,   lobster, other seafoods. Liver, organ meats. °· Avoid: Tough, fibrous meats with gristle. Peanut butter, smooth or chunky. Cheese, nuts, seeds, legumes, dried peas, beans, lentils. °Dairy °· Recommended: Yogurt, lactose-free milk, kefir, drinkable yogurt, buttermilk, soy milk, or plain hard cheese. °· Avoid: Milk, chocolate milk, beverages made with milk, such as milkshakes. °Soups °· Recommended: Bouillon, broth, or soups made from allowed foods. Any strained soup. °· Avoid: Soups made from vegetables that are not allowed, cream or milk-based soups. °Desserts and Sweets °· Recommended: Sugar-free gelatin, sugar-free frozen ice pops made without sugar alcohol. °· Avoid: Plain cakes and cookies, pie made with fruit, pudding, custard, cream pie. Gelatin, fruit, ice, sherbet, frozen ice pops. Ice cream, ice milk without nuts. Plain hard candy, honey, jelly, molasses, syrup, sugar, chocolate syrup, gumdrops, marshmallows. °Fats and Oils °· Recommended: Limit fats to less than 8 tsp per day. °· Avoid: Seeds, nuts, olives, avocados. Margarine, butter, cream, mayonnaise, salad oils, plain salad dressings. Plain gravy, crisp bacon without rind. °Beverages °· Recommended: Water, decaffeinated teas, oral rehydration solutions, sugar-free beverages not sweetened with sugar alcohols. °· Avoid: Fruit juices, caffeinated beverages (coffee, tea, soda), alcohol, sports drinks, or lemon-lime soda. °Condiments °· Recommended: Ketchup, mustard, horseradish, vinegar, cocoa powder. Spices in moderation: allspice, basil, bay leaves, celery powder or leaves, cinnamon, cumin powder, curry powder, ginger, mace, marjoram, onion or garlic powder, oregano, paprika, parsley flakes, ground pepper, rosemary, sage, savory, tarragon, thyme, turmeric. °· Avoid: Coconut, honey. °Document Released: 09/21/2003 Document Revised: 03/25/2012 Document Reviewed: 11/15/2011 °[employer]® Patient Information ©2014 [employer]. ° °Viral  Gastroenteritis °Viral gastroenteritis is also known as stomach flu. This condition affects the stomach and intestinal tract. It can cause sudden diarrhea and vomiting. The illness typically lasts 3 to 8 days. Most people develop an immune response that eventually gets rid of the virus. While this natural response develops, the virus can make you quite ill. °CAUSES  °Many different viruses can cause gastroenteritis, such as rotavirus or noroviruses. You can catch one of these viruses by consuming contaminated food or water. You may also catch a virus by sharing utensils or other personal items with an infected person or by touching a contaminated surface. °SYMPTOMS  °The most common symptoms are diarrhea and vomiting. These problems can cause a severe loss of body fluids (dehydration) and a body salt (electrolyte) imbalance. Other symptoms may include: °· Fever. °· Headache. °· Fatigue. °· Abdominal pain. °DIAGNOSIS  °Your caregiver can usually diagnose viral gastroenteritis based on your symptoms and a physical exam. A stool sample may also be taken to test for the presence of viruses or other infections. °TREATMENT  °This illness typically goes away on its own. Treatments are aimed at rehydration. The most serious cases of viral gastroenteritis involve vomiting so severely that you are not able to keep fluids down. In these cases, fluids must be given through an intravenous line (IV). °HOME CARE INSTRUCTIONS  °· Drink enough fluids to keep your urine clear or pale yellow. Drink small amounts of fluids frequently and increase the amounts as tolerated. °· Ask your caregiver for specific rehydration instructions. °· Avoid: °· Foods high in sugar. °· Alcohol. °· Carbonated drinks. °· Tobacco. °· Juice. °· Caffeine drinks. °· Extremely hot or cold fluids. °· Fatty, greasy foods. °· Too much intake of anything at one time. °· Dairy products until 24 to 48 hours after diarrhea stops. °· You may consume probiotics.  Probiotics are active cultures of beneficial bacteria. They may lessen the amount and number   of diarrheal stools in adults. Probiotics can be found in yogurt with active cultures and in supplements. °· Wash your hands well to avoid spreading the virus. °· Only take over-the-counter or prescription medicines for pain, discomfort, or fever as directed by your caregiver. Do not give aspirin to children. Antidiarrheal medicines are not recommended. °· Ask your caregiver if you should continue to take your regular prescribed and over-the-counter medicines. °· Keep all follow-up appointments as directed by your caregiver. °SEEK IMMEDIATE MEDICAL CARE IF:  °· You are unable to keep fluids down. °· You do not urinate at least once every 6 to 8 hours. °· You develop shortness of breath. °· You notice blood in your stool or vomit. This may look like coffee grounds. °· You have abdominal pain that increases or is concentrated in one small area (localized). °· You have persistent vomiting or diarrhea. °· You have a fever. °· The patient is a child younger than 3 months, and he or she has a fever. °· The patient is a child older than 3 months, and he or she has a fever and persistent symptoms. °· The patient is a child older than 3 months, and he or she has a fever and symptoms suddenly get worse. °· The patient is a baby, and he or she has no tears when crying. °MAKE SURE YOU:  °· Understand these instructions. °· Will watch your condition. °· Will get help right away if you are not doing well or get worse. °Document Released: 07/01/2005 Document Revised: 09/23/2011 Document Reviewed: 04/17/2011 °[employer]® Patient Information ©2014 [employer]. ° °

## 2013-11-23 ENCOUNTER — Observation Stay (HOSPITAL_COMMUNITY)
Admission: EM | Admit: 2013-11-23 | Discharge: 2013-11-24 | Disposition: A | Discharge status: Home / Self Care | Payer: BC Managed Care – PPO | Attending: Cardiology | Admitting: Cardiology

## 2013-11-23 ENCOUNTER — Emergency Department (HOSPITAL_COMMUNITY): Payer: BC Managed Care – PPO

## 2013-11-23 ENCOUNTER — Encounter (HOSPITAL_COMMUNITY): Payer: Self-pay | Admitting: Emergency Medicine

## 2013-11-23 DIAGNOSIS — G4700 Insomnia, unspecified: Secondary | ICD-10-CM | POA: Diagnosis present

## 2013-11-23 DIAGNOSIS — F1210 Cannabis abuse, uncomplicated: Secondary | ICD-10-CM | POA: Insufficient documentation

## 2013-11-23 DIAGNOSIS — G4730 Sleep apnea, unspecified: Secondary | ICD-10-CM | POA: Diagnosis present

## 2013-11-23 DIAGNOSIS — I480 Paroxysmal atrial fibrillation: Secondary | ICD-10-CM | POA: Diagnosis present

## 2013-11-23 DIAGNOSIS — I4891 Unspecified atrial fibrillation: Secondary | ICD-10-CM

## 2013-11-23 DIAGNOSIS — Z6841 Body Mass Index (BMI) 40.0 and over, adult: Secondary | ICD-10-CM | POA: Insufficient documentation

## 2013-11-23 LAB — COMPREHENSIVE METABOLIC PANEL
ALT: 20 U/L (ref 0–53)
AST: 23 U/L (ref 0–37)
Albumin: 3.4 g/dL — ABNORMAL LOW (ref 3.5–5.2)
Alkaline Phosphatase: 51 U/L (ref 39–117)
BILIRUBIN TOTAL: 0.4 mg/dL (ref 0.3–1.2)
BUN: 8 mg/dL (ref 6–23)
CO2: 24 meq/L (ref 19–32)
CREATININE: 1.08 mg/dL (ref 0.50–1.35)
Calcium: 9.4 mg/dL (ref 8.4–10.5)
Chloride: 101 mEq/L (ref 96–112)
GFR, EST NON AFRICAN AMERICAN: 87 mL/min — AB (ref >90)
GLUCOSE: 92 mg/dL (ref 70–99)
Potassium: 4.3 mEq/L (ref 3.7–5.3)
Sodium: 137 mEq/L (ref 137–147)
Total Protein: 7.3 g/dL (ref 6.0–8.3)

## 2013-11-23 LAB — CBC WITH DIFFERENTIAL/PLATELET
Basophils Absolute: 0 10*3/uL (ref 0.0–0.1)
Basophils Relative: 1 % (ref 0–1)
Eosinophils Absolute: 0.2 10*3/uL (ref 0.0–0.7)
Eosinophils Relative: 4 % (ref 0–5)
HEMATOCRIT: 46.9 % (ref 39.0–52.0)
HEMOGLOBIN: 16.1 g/dL (ref 13.0–17.0)
LYMPHS ABS: 1.4 10*3/uL (ref 0.7–4.0)
LYMPHS PCT: 27 % (ref 12–46)
MCH: 28.9 pg (ref 26.0–34.0)
MCHC: 34.3 g/dL (ref 30.0–36.0)
MCV: 84.1 fL (ref 78.0–100.0)
MONO ABS: 0.5 10*3/uL (ref 0.1–1.0)
Monocytes Relative: 9 % (ref 3–12)
NEUTROS ABS: 3 10*3/uL (ref 1.7–7.7)
Neutrophils Relative %: 59 % (ref 43–77)
Platelets: 219 10*3/uL (ref 150–400)
RBC: 5.58 MIL/uL (ref 4.22–5.81)
RDW: 12.8 % (ref 11.5–15.5)
WBC: 5 10*3/uL (ref 4.0–10.5)

## 2013-11-23 LAB — RAPID URINE DRUG SCREEN, HOSP PERFORMED
Amphetamines: NOT DETECTED
BARBITURATES: NOT DETECTED
BENZODIAZEPINES: NOT DETECTED
Cocaine: NOT DETECTED
Opiates: NOT DETECTED
Tetrahydrocannabinol: POSITIVE — AB

## 2013-11-23 LAB — I-STAT TROPONIN, ED: Troponin i, poc: 0 ng/mL (ref 0.00–0.08)

## 2013-11-23 NOTE — ED Notes (Signed)
Pt reports palpitations since yesterday.  Pt reports palpitation stops when she stops moving and rests.  Pt denies any lightheadedness or cp at this time.

## 2013-11-23 NOTE — H&P (Addendum)
History and Physical   Patient ID: Charles BentJason Watson MRN: [id number], DOB/AGE: 35/03/1981   Admit date: 11/23/2013 Date of Consult: 11/23/2013  Primary Physician: Sanda Lingerhomas Jones, MD Primary Cardiologist: Anderson MaltaUnattached, [facility].  HPI: Charles Hoffman is a 33 y.o. AA  male daily cannibus smoker with no known PMHx who [facility] with c/o palpitations found to be in AFib at a controlled ventricular rate.  Patient has not had palpitations before.  He states that he was told that he had a murmur as a child but is not aware of any prior heart disease.  He smokes marijuana daily but denies use of any other illicit/IV drugs.  No prior h/o congenital HD/CAD/MI/arrhythmia/stroke/TIA.  [facility] condition with c/o intermittent palpitations in the setting of a controlled ventricular response rate in AFib.  Problem List: Past Medical History  Diagnosis Date  . Asthma     Past Surgical History  Procedure Laterality Date  . Thumb arthroscopy       Allergies: No Known Allergies  Home Medications: Prior to Admission medications   Not on File    Inpatient Medications:    (Not in a hospital admission)  Family History  Problem Relation Age of Onset  . Diabetes Other      History   Social History  . Marital Status: Married    Spouse Name: N/A    Number of Children: N/A  . Years of Education: N/A   Occupational History  . Not on file.   Social History Main Topics  . Smoking status: Never Smoker   . Smokeless tobacco: Not on file  . Alcohol Use: Yes  . Drug Use: Yes    Special: Marijuana  . Sexual Activity: Not on file   Other Topics Concern  . Not on file   Social History Narrative  . No narrative on file     Review of Systems: All other systems reviewed and are otherwise negative except as noted above.  Physical Exam: Blood pressure 97/62, pulse 66, temperature 98.2 F (36.8 C), temperature source Oral, resp. rate 18,  SpO2 95.00%.  General: Well developed, well nourished, in no acute distress. Head: Normocephalic, atraumatic, sclera non-icteric, no xanthomas, nares are without discharge.  Neck: Negative for carotid bruits. JVD not elevated. Lungs: Clear bilaterally to auscultation without wheezes, rales, or rhonchi. Breathing is unlabored. Heart: RRR with S1 S2. No murmurs, rubs, or gallops appreciated. Abdomen: Soft, non-tender, non-distended with normoactive bowel sounds. No hepatomegaly. No rebound/guarding. No obvious abdominal masses. Msk: Strength and tone appears normal for age. Extremities: No clubbing, cyanosis or edema.  Distal pedal pulses are 2+ and equal bilaterally. Neuro: Alert and oriented X 3. Moves all extremities spontaneously. Psych:  Responds to questions appropriately with a normal affect.  Labs: Recent Labs     11/23/13  2055  WBC  5.0  HGB  16.1  HCT  46.9  MCV  84.1  PLT  219   No results found for this basename: VITAMINB12, FOLATE, FERRITIN, TIBC, IRON, RETICCTPCT,  in the last 72 hours No results found for this basename: DDIMER,  in the last 72 hours  Recent Labs Lab 11/23/13 2055  NA 137  K 4.3  CL 101  CO2 24  BUN 8  CREATININE 1.08  CALCIUM 9.4  PROT 7.3  BILITOT 0.4  ALKPHOS 51  ALT 20  AST 23  GLUCOSE 92   No results found for  this basename: HGBA1C,  in the last 72 hours No results found for this basename: CKTOTAL, CKMB, CKMBINDEX, TROPONINI,  in the last 72 hours No components found with this basename: POCBNP,  No results found for this basename: CHOL, HDL, LDLCALC, TRIG, CHOLHDL, LDLDIRECT,  in the last 72 hours No results found for this basename: TSH, T4TOTAL, FREET3, T3FREE, THYROIDAB,  in the last 72 hours  Radiology/Studies: Dg Chest 2 View  11/23/2013   CLINICAL DATA:  Cardiac palpitations  EXAM: CHEST  2 VIEW  COMPARISON:  06/22/2009  FINDINGS: Cardiac shadow is within normal limits. The lungs are well aerated bilaterally. No acute bony  abnormality is seen.  IMPRESSION: No active cardiopulmonary disease.   Electronically Signed   By: Alcide CleverMark  Lukens M.D.   On: 11/23/2013 21:53    11/23/13 12-lead ECG:  AFib (coarse) with controlled VR, ST elevation in multiple leads consistent with early repolarization pattern.  ASSESSMENT:  33 yo AA Male daily cannibus smoker who has no known medical hx but has had palpitations in the past who [facility] and was found to be in AFib with controlled ventricular response.  PLAN:  1-Admit to Floor status with Telemetry; service of on-call Cardiologist, Dr. Tobias AlexanderKatarina Nelson. 2-New onset AFib:  HR's are between 70-80 bpm thus no need for rate control overnight.  Keep NPO after midnight, will anticoagulate with LMWH for now, can pursue a plan for TEE+DCCV in AM followed by short course of anticoagulation for 6 weeks (likely with a NOAC if possible).  If short-term anticoagulation is not preferred in this rhythm-control strategy patient, then alternatively aspirin 81mg  daily could be used as he has a CHADS2-VASC score=0 which confers a 0% annual stroke risk in this patient. 3-Early repolarization pattern on ECG.  Stable finding. 4-Plan to do complete TEE in AM for myocardial function and cardiac structural abnormalities.  Alternatively, a separate 2D surface Echo could be ordered if TEE is only going to focus on LAA. 5-Patient needs counseling on abstinence from illicit drugs such as marijuana.  He denies use of any other illicit/IV drugs and his urine toxicology screen was negative for all else.  Code Status:  FULL CODE.  Signed, Christie Nottinghamahul Shatha Hooser, MD [facility] [place] Physician in Cardiology  11/23/2013, 11:46 PM

## 2013-11-23 NOTE — ED Provider Notes (Signed)
CSN: [id number]     Arrival date & time 11/23/13  2003 History   First MD Initiated Contact with Patient 11/23/13 2053     Chief Complaint  Patient presents with  . Palpitations     (Consider location/radiation/quality/duration/timing/severity/associated sxs/prior Treatment) HPI Charles Hoffman is a 33 y.o. male who presents to ED with complaint of palpitations. States started yesterday afternoon. Felt like "heart racing." began while driving a car. States symptoms improve when resting, worsened with excerption. Denies any chest pain or shortness of breath. Denies any fever or chills. No cough. No swelling in extremities. Reports hx of asthma but this does not feel like asthma. Denies any drugs other than marijuana daily, denies alcohol. No medications. No supplements. Denies large amount of caffeinated beverages. Denies smoking. No history of the same. Does not have a PC.   Past Medical History  Diagnosis Date  . Asthma    Past Surgical History  Procedure Laterality Date  . Thumb arthroscopy     Family History  Problem Relation Age of Onset  . Diabetes Other    History  Substance Use Topics  . Smoking status: Never Smoker   . Smokeless tobacco: Not on file  . Alcohol Use: Yes    Review of Systems  Constitutional: Negative for fever and chills.  Respiratory: Negative for cough, chest tightness and shortness of breath.   Cardiovascular: Positive for palpitations. Negative for chest pain and leg swelling.  Gastrointestinal: Negative for nausea, vomiting, abdominal pain, diarrhea and abdominal distention.  Genitourinary: Negative for dysuria, urgency, frequency and hematuria.  Musculoskeletal: Negative for arthralgias and myalgias.  Skin: Negative for rash.  Allergic/Immunologic: Negative for immunocompromised state.  Neurological: Negative for dizziness, weakness, light-headedness, numbness and headaches.  All other systems reviewed and are negative.     Allergies  Review of  patient's allergies indicates no known allergies.  Home Medications   Prior to Admission medications   Not on File   BP 95/54  Pulse 86  Temp(Src) 98.4 F (36.9 C) (Oral)  Resp 14  SpO2 98% Physical Exam  Nursing note and vitals reviewed. Constitutional: He appears well-developed and well-nourished. No distress.  HENT:  Head: Normocephalic and atraumatic.  Eyes: Conjunctivae are normal.  Neck: Neck supple.  Cardiovascular: Normal rate, S1 normal, S2 normal, normal heart sounds and intact distal pulses.  An irregularly irregular rhythm present.  No murmur heard. Pulmonary/Chest: Effort normal. No respiratory distress. He has no wheezes. He has no rales.  Abdominal: Soft. Bowel sounds are normal. He exhibits no distension. There is no tenderness. There is no rebound.  Musculoskeletal: He exhibits no edema.  Neurological: He is alert.  Skin: Skin is warm and dry.    ED Course  Procedures (including critical care time) Labs Review Labs Reviewed  COMPREHENSIVE METABOLIC PANEL - Abnormal; Notable for the following:    Albumin 3.4 (*)    GFR calc non Af Amer 87 (*)    All other components within normal limits  URINE RAPID DRUG SCREEN (HOSP PERFORMED) - Abnormal; Notable for the following:    Tetrahydrocannabinol POSITIVE (*)    All other components within normal limits  CBC WITH DIFFERENTIAL  TSH  I-STAT TROPOININ, ED    Imaging Review Dg Chest 2 View  11/23/2013   CLINICAL DATA:  Cardiac palpitations  EXAM: CHEST  2 VIEW  COMPARISON:  06/22/2009  FINDINGS: Cardiac shadow is within normal limits. The lungs are well aerated bilaterally. No acute bony abnormality is seen.  IMPRESSION:  No active cardiopulmonary disease.   Electronically Signed   By: Alcide CleverMark  Lukens M.D.   On: 11/23/2013 21:53     EKG Interpretation   Date/Time:  Tuesday Nov 23 2013 20:32:20 EDT Ventricular Rate:  92 PR Interval:    QRS Duration: 81 QT Interval:  331 QTC Calculation: 409 R Axis:    71 Text Interpretation:  Atrial fibrillation Borderline T wave abnormalities  Confirmed by Freida BusmanALLEN  MD, ANTHONY ([id number]) on 11/23/2013 11:48:34 PM      MDM   Final diagnoses:  A-fib    Pt with palpitations since yesterday. No cp, no sob. A-fib on monitor, rate normal. Will get labs, CXR, trop. No hx of the same.    11:46 PM Pt's labs normal. Discussed with cardiology, Dr. Glidden CallasSharma, advised admission vs dc home with outpatient follow up tomorrow. Pt does not have a cardiologist. Question if is able to be seen tomorrrow, pt agreed to admission. Will admit to cardiology service for possible cardioversion tomorrow. Pt is currently asymptomatic with exception of mild palpitations. No CP or SOB at this time. Will transfer for admission.   Filed Vitals:   11/23/13 2012 11/23/13 2100 11/23/13 2200 11/23/13 2327  BP: 133/85 102/66 95/54 97/62   Pulse: 70  86 66  Temp: 98.4 F (36.9 C)   98.2 F (36.8 C)  TempSrc: Oral   Oral  Resp: 20 21 14 18   SpO2: 100%  98% 95%       Gene Glazebrook A Linah Klapper, PA-C 11/23/13 2349

## 2013-11-23 NOTE — ED Notes (Signed)
[facility] was called and [facility]; report on pt was also given.

## 2013-11-23 NOTE — ED Provider Notes (Signed)
Medical screening examination/treatment/procedure(s) were conducted as a shared visit with non-physician practitioner(s) and myself.  I personally evaluated the patient during the encounter.   EKG Interpretation None      Date: 11/23/2013  Rate: 79  Rhythm: atrial fibrillation  QRS Axis: normal  Intervals: normal  ST/T Wave abnormalities: nonspecific ST changes  Conduction Disutrbances:none  Narrative Interpretation:   Old EKG Reviewed: none available  Patient here with palpitations. EKG shows new onset atrial fibrillation. [facility]  Toy BakerAnthony T Hadlei Stitt, MD 11/23/13 2255

## 2013-11-24 ENCOUNTER — Encounter (HOSPITAL_COMMUNITY): Payer: Self-pay | Admitting: *Deleted

## 2013-11-24 ENCOUNTER — Other Ambulatory Visit: Payer: Self-pay | Admitting: Cardiology

## 2013-11-24 DIAGNOSIS — I4891 Unspecified atrial fibrillation: Secondary | ICD-10-CM

## 2013-11-24 DIAGNOSIS — I480 Paroxysmal atrial fibrillation: Secondary | ICD-10-CM

## 2013-11-24 LAB — CBC WITH DIFFERENTIAL/PLATELET
Basophils Absolute: 0 10*3/uL (ref 0.0–0.1)
Basophils Relative: 1 % (ref 0–1)
EOS ABS: 0.2 10*3/uL (ref 0.0–0.7)
EOS PCT: 4 % (ref 0–5)
HCT: 47.4 % (ref 39.0–52.0)
HEMOGLOBIN: 16.3 g/dL (ref 13.0–17.0)
Lymphocytes Relative: 36 % (ref 12–46)
Lymphs Abs: 1.6 10*3/uL (ref 0.7–4.0)
MCH: 29.4 pg (ref 26.0–34.0)
MCHC: 34.4 g/dL (ref 30.0–36.0)
MCV: 85.4 fL (ref 78.0–100.0)
MONOS PCT: 8 % (ref 3–12)
Monocytes Absolute: 0.3 10*3/uL (ref 0.1–1.0)
Neutro Abs: 2.4 10*3/uL (ref 1.7–7.7)
Neutrophils Relative %: 51 % (ref 43–77)
PLATELETS: 203 10*3/uL (ref 150–400)
RBC: 5.55 MIL/uL (ref 4.22–5.81)
RDW: 12.8 % (ref 11.5–15.5)
WBC: 4.6 10*3/uL (ref 4.0–10.5)

## 2013-11-24 LAB — BASIC METABOLIC PANEL
BUN: 8 mg/dL (ref 6–23)
CALCIUM: 9.2 mg/dL (ref 8.4–10.5)
CO2: 26 mEq/L (ref 19–32)
Chloride: 103 mEq/L (ref 96–112)
Creatinine, Ser: 1.14 mg/dL (ref 0.50–1.35)
GFR calc Af Amer: >90 mL/min (ref >90)
GFR, EST NON AFRICAN AMERICAN: 82 mL/min — AB (ref >90)
GLUCOSE: 81 mg/dL (ref 70–99)
Potassium: 4.2 mEq/L (ref 3.7–5.3)
Sodium: 140 mEq/L (ref 137–147)

## 2013-11-24 LAB — PROTIME-INR
INR: 0.97 (ref 0.00–1.49)
Prothrombin Time: 12.7 seconds (ref 11.6–15.2)

## 2013-11-24 LAB — TROPONIN I: Troponin I: <0.3 ng/mL (ref <0.30)

## 2013-11-24 LAB — TSH: TSH: 2.32 u[IU]/mL (ref 0.350–4.500)

## 2013-11-24 MED ORDER — SODIUM CHLORIDE 0.9 % IV SOLN: INTRAVENOUS | Status: DC

## 2013-11-24 MED ORDER — SODIUM CHLORIDE 0.9 % IJ SOLN
3.0000 mL | Freq: Two times a day (BID) | INTRAMUSCULAR | Status: DC
  Administered 2013-11-24: 3 mL via INTRAVENOUS

## 2013-11-24 MED ORDER — ENOXAPARIN SODIUM 120 MG/0.8ML ~~LOC~~ SOLN
1.0000 mg/kg | Freq: Once | SUBCUTANEOUS | Status: AC
  Administered 2013-11-24: 120 mg via SUBCUTANEOUS
  Filled 2013-11-24: qty 0.8

## 2013-11-24 MED ORDER — METOPROLOL TARTRATE 25 MG PO TABS: 25.0000 mg | ORAL_TABLET | Freq: Two times a day (BID) | ORAL | Status: DC | PRN

## 2013-11-24 MED ORDER — ASPIRIN EC 81 MG PO TBEC
81.0000 mg | DELAYED_RELEASE_TABLET | Freq: Every day | ORAL | Status: DC
  Filled 2013-11-24: qty 1

## 2013-11-24 MED ORDER — ENOXAPARIN SODIUM 120 MG/0.8ML ~~LOC~~ SOLN
1.0000 mg/kg | Freq: Two times a day (BID) | SUBCUTANEOUS | Status: DC
  Filled 2013-11-24 (×2): qty 0.8

## 2013-11-24 MED ORDER — SODIUM CHLORIDE 0.9 % IJ SOLN
3.0000 mL | INTRAMUSCULAR | Status: DC | PRN
Start: 2013-11-24 — End: 2013-11-24

## 2013-11-24 MED ORDER — SODIUM CHLORIDE 0.9 % IV SOLN: 250.0000 mL | INTRAVENOUS | Status: DC | PRN

## 2013-11-24 MED ORDER — ASPIRIN 81 MG PO TBEC: 81.0000 mg | DELAYED_RELEASE_TABLET | Freq: Every day | ORAL | Status: DC

## 2013-11-24 NOTE — Discharge Instructions (Signed)
Atrial Fibrillation  Atrial fibrillation is a type of irregular heart rhythm (arrhythmia). During atrial fibrillation, the upper chambers of the heart (atria) quiver continuously in a chaotic pattern. This causes an irregular and often rapid heart rate.   Atrial fibrillation is the result of the heart becoming overloaded with disorganized signals that tell it to beat. These signals are normally released one at a time by a part of the right atrium called the sinoatrial node. They then travel from the atria to the lower chambers of the heart (ventricles), causing the atria and ventricles to contract and pump blood as they pass. In atrial fibrillation, parts of the atria outside of the sinoatrial node also release these signals. This results in two problems. First, the atria receive so many signals that they do not have time to fully contract. Second, the ventricles, which can only receive one signal at a time, beat irregularly and out of rhythm with the atria.   There are three types of atrial fibrillation:    Paroxysmal Paroxysmal atrial fibrillation starts suddenly and stops on its own within a week.    Persistent Persistent atrial fibrillation lasts for more than a week. It may stop on its own or with treatment.    Permanent Permanent atrial fibrillation does not go away. Episodes of atrial fibrillation may lead to permanent atrial fibrillation.   Atrial fibrillation can prevent your heart from pumping blood normally. It increases your risk of stroke and can lead to heart failure.   CAUSES    Heart conditions, including a heart attack, heart failure, coronary artery disease, and heart valve conditions.    Inflammation of the sac that surrounds the heart (pericarditis).    Blockage of an artery in the lungs (pulmonary embolism).    Pneumonia or other infections.    Chronic lung disease.    Thyroid problems, especially if the thyroid is overactive (hyperthyroidism).    Caffeine, excessive alcohol  use, and use of some illegal drugs.    Use of some medications, including certain decongestants and diet pills.    Heart surgery.    Birth defects.   Sometimes, no cause can be found. When this happens, the atrial fibrillation is called lone atrial fibrillation. The risk of complications from atrial fibrillation increases if you have lone atrial fibrillation and you are age 60 years or older.  RISK FACTORS   Heart failure.   Coronary artery disease   Diabetes mellitus.    High blood pressure (hypertension).    Obesity.    Other arrhythmias.    Increased age.  SYMPTOMS    A feeling that your heart is beating rapidly or irregularly.    A feeling of discomfort or pain in your chest.    Shortness of breath.    Sudden lightheadedness or weakness.    Getting tired easily when exercising.    Urinating more often than normal (mainly when atrial fibrillation first begins).   In paroxysmal atrial fibrillation, symptoms may start and suddenly stop.  DIAGNOSIS   Your caregiver may be able to detect atrial fibrillation when taking your pulse. Usually, testing is needed to diagnosis atrial fibrillation. Tests may include:    Electrocardiography. During this test, the electrical impulses of your heart are recorded while you are lying down.    Echocardiography. During echocardiography, sound waves are used to evaluate how blood flows through your heart.    Stress test. There is more than one type of stress test. If a stress test is   needed, ask your caregiver about which type is best for you.    Chest X-ray exam.    Blood tests.    Computed tomography (CT).   TREATMENT    Treating any underlying conditions. For example, if you have an overactive thyroid, treating the condition may correct atrial fibrillation.    Medication. Medications may be given to control a rapid heart rate or to prevent blood clots, heart failure, or a stroke.    Procedure to correct the rhythm of the  heart:   Electrical cardioversion. During electrical cardioversion, a controlled, low-energy shock is delivered to the heart through your skin. If you have chest pain, very low pressure blood pressure, or sudden heart failure, this procedure may need to be done as an emergency.   Catheter ablation. During this procedure, heart tissues that send the signals that cause atrial fibrillation are destroyed.   Maze or minimaze procedure. During this surgery, thin lines of heart tissue that carry the abnormal signals are destroyed. The maze procedure is an open-heart surgery. The minimaze procedure is a minimally invasive surgery. This means that small cuts are made to access the heart instead of a large opening.   Pulmonary venous isolation. During this surgery, tissue around the veins that carry blood from the lungs (pulmonary veins) is destroyed. This tissue is thought to carry the abnormal signals.  HOME CARE INSTRUCTIONS    Take medications as directed by your caregiver.   Only take medications that your caregiver approves. Some medications can make atrial fibrillation worse or recur.   If blood thinners were prescribed by your caregiver, take them exactly as directed. Too much can cause bleeding. Too little and you will not have the needed protection against stroke and other problems.   Perform blood tests at home if directed by your caregiver.   Perform blood tests exactly as directed.    Quit smoking if you smoke.    Do not drink alcohol.    Do not drink caffeinated beverages such as coffee, soda, and some teas. You may drink decaffeinated coffee, soda, or tea.    Maintain a healthy weight. Do not use diet pills unless your caregiver approves. They may make heart problems worse.    Follow diet instructions as directed by your caregiver.    Exercise regularly as directed by your caregiver.    Keep all follow-up appointments.  PREVENTION   The following substances can cause atrial fibrillation  to recur:    Caffeinated beverages.    Alcohol.    Certain medications, especially those used for breathing problems.    Certain herbs and herbal medications, such as those containing ephedra or ginseng.   Illegal drugs such as cocaine and amphetamines.  Sometimes medications are given to prevent atrial fibrillation from recurring. Proper treatment of any underlying condition is also important in helping prevent recurrence.   SEEK MEDICAL CARE IF:   You notice a change in the rate, rhythm, or strength of your heartbeat.    You suddenly begin urinating more frequently.    You tire more easily when exerting yourself or exercising.   SEEK IMMEDIATE MEDICAL CARE IF:    You develop chest pain, abdominal pain, sweating, or weakness.   You feel sick to your stomach (nauseous).   You develop shortness of breath.   You suddenly develop swollen feet and ankles.   You feel dizzy.   You face or limbs feel numb or weak.   There is a change in your   vision or speech.  MAKE SURE YOU:    Understand these instructions.   Will watch your condition.   Will get help right away if you are not doing well or get worse.  Document Released: 07/01/2005 Document Revised: 10/26/2012 Document Reviewed: 08/11/2012  [employer] Patient Information 2014 [employer].

## 2013-11-24 NOTE — Progress Notes (Signed)
11/24/2013 12:19 PM Nursing note Discharge avs form, medications already taken today and those due this evening given and explained to patient. How to take a pulse reviewed using teach back method. Follow up appointments, diet restrictions and when to call MD reviewed. D/c iv line. D/c tele. D/c home per orders.  Blanchard KelchStephanie Ingold Tamaj Jurgens

## 2013-11-24 NOTE — Progress Notes (Signed)
    Subjective:  Converted to NSR. No CP  NO SOB Objective:  Vital Signs in the last 24 hours: Temp:  [98 F (36.7 C)-98.4 F (36.9 C)] 98.2 F (36.8 C) (05/13 0500) Pulse Rate:  [66-90] 90 (05/13 0500) Resp:  [14-21] 18 (05/13 0500) BP: (95-138)/(54-87) 121/71 mmHg (05/13 0500) SpO2:  [95 %-100 %] 98 % (05/13 0500) Weight:  [261 lb 11 oz (118.7 kg)-280 lb (127.007 kg)] 261 lb 11 oz (118.7 kg) (05/13 0041)  Intake/Output from previous day: No intake or output data in the 24 hours ending 11/24/13 1001  Physical Exam: General appearance: alert, cooperative, no distress and morbidly obese Lungs: clear to auscultation bilaterally Heart: regular rate and rhythm   Rate: 80  Rhythm: normal sinus rhythm  Lab Results:  Recent Labs  11/23/13 2055 11/24/13 0255  WBC 5.0 4.6  HGB 16.1 16.3  PLT 219 203    Recent Labs  11/23/13 2055 11/24/13 0255  NA 137 140  K 4.3 4.2  CL 101 103  CO2 24 26  GLUCOSE 92 81  BUN 8 8  CREATININE 1.08 1.14    Recent Labs  11/24/13 0255 11/24/13 0745  TROPONINI <0.30 <0.30    Recent Labs  11/24/13 0255  INR 0.97    Imaging: Imaging results have been reviewed  Cardiac Studies:  Assessment/Plan:  33 y.o. AA male with no known PMHx who [facility] c/o palpitations found to be in AFib. He admits to smoking marijuana but says he hasn't smoked in 2 days. Not Alcohol or OTC use. He does drink caffeine. Drug screen positive for THC but negative for other substances. He has converted spontaneously. His history is consistent with sleep apnea.     Principal Problem:   PAF (paroxysmal atrial fibrillation) Active Problems:   Suspected sleep apnea   Morbid obesity- BMI 41   INSOMNIA-SLEEP DISORDER-UNSPEC    PLAN: Consider discharge with plans for OP echo and sleep study. He needs wgt loss and should avoid THC an other stimulants. PRN Lopressor 25 mg for palpitations and ASA. Check EKG this am, TSH WNL.   Corine ShelterLuke  Kilroy PA-C Beeper [phone] 11/24/2013, 10:01 AM   Patinet seen and examined  Amended note to reflect my findings OK for D/C Patient says he may have had one other spell like yesterday while in high school  Discussed mech, etiology of afib with patient  Recomm echo. ASA  B Blocker PRN F?U in clinic  Pti to discuss with wife sleep patterns Sleeps during day  Works at night  He snores  ? If has apneic spells.    Pricilla RifflePaula V Latarsha Zani

## 2013-11-24 NOTE — Discharge Summary (Addendum)
Patient ID: Charles Hoffman,  MRN: [id number], DOB/AGE: 10-03-1980 33 y.o.  Admit date: 11/23/2013 Discharge date: 11/24/2013  Primary Care Provider:   Primary Cardiologist: Dr Harrington Challenger (new)  Discharge Diagnoses Principal Problem:   PAF (paroxysmal atrial fibrillation) Active Problems:   Suspected sleep apnea   Morbid obesity- BMI 41   INSOMNIA-SLEEP [facility]    Hospital Course:  33 y.o. AA male with no known PMHx who presented to the [facility] ED with c/o palpitations found to be in AFib. He admits to smoking marijuana but says he hasn't smoked in 2 days. No Alcohol or OTC decongestant use. He does drink caffeine (tae). Drug screen positive for THC but negative for other substances. He converted spontaneously. His history is consistent with possible sleep apnea. Plan is for an OP echo, ASA, and prn beta blocker. We urged him to avoid THC, caffeine and any OTC decongestants with a "D" on the end. We will need to interview his wife at follow up to see if a sleep study is indicated.    Discharge Vitals:  Blood pressure 121/71, pulse 90, temperature 98.2 F (36.8 C), temperature source Oral, resp. rate 18, height 5' 9"  (1.753 m), weight 261 lb 11 oz (118.7 kg), SpO2 98.00%.    Labs: Results for orders placed during the hospital encounter of 11/23/13 (from the past 48 hour(s))  CBC WITH DIFFERENTIAL     Status: None   Collection Time    11/23/13  8:55 PM      Result Value Ref Range   WBC 5.0  4.0 - 10.5 K/uL   RBC 5.58  4.22 - 5.81 MIL/uL   Hemoglobin 16.1  13.0 - 17.0 g/dL   HCT 46.9  39.0 - 52.0 %   MCV 84.1  78.0 - 100.0 fL   MCH 28.9  26.0 - 34.0 pg   MCHC 34.3  30.0 - 36.0 g/dL   RDW 12.8  11.5 - 15.5 %   Platelets 219  150 - 400 K/uL   Neutrophils Relative % 59  43 - 77 %   Neutro Abs 3.0  1.7 - 7.7 K/uL   Lymphocytes Relative 27  12 - 46 %   Lymphs Abs 1.4  0.7 - 4.0 K/uL   Monocytes Relative 9  3 - 12 %   Monocytes Absolute 0.5  0.1 - 1.0 K/uL   Eosinophils Relative 4   0 - 5 %   Eosinophils Absolute 0.2  0.0 - 0.7 K/uL   Basophils Relative 1  0 - 1 %   Basophils Absolute 0.0  0.0 - 0.1 K/uL  COMPREHENSIVE METABOLIC PANEL     Status: Abnormal   Collection Time    11/23/13  8:55 PM      Result Value Ref Range   Sodium 137  137 - 147 mEq/L   Potassium 4.3  3.7 - 5.3 mEq/L   Chloride 101  96 - 112 mEq/L   CO2 24  19 - 32 mEq/L   Glucose, Bld 92  70 - 99 mg/dL   BUN 8  6 - 23 mg/dL   Creatinine, Ser 1.08  0.50 - 1.35 mg/dL   Calcium 9.4  8.4 - 10.5 mg/dL   Total Protein 7.3  6.0 - 8.3 g/dL   Albumin 3.4 (*) 3.5 - 5.2 g/dL   AST 23  0 - 37 U/L   ALT 20  0 - 53 U/L   Alkaline Phosphatase 51  39 - 117 U/L  Total Bilirubin 0.4  0.3 - 1.2 mg/dL   GFR calc non Af Amer 87 (*) >90 mL/min   GFR calc Af Amer >90  >90 mL/min   Comment: (NOTE)     The eGFR has been calculated using the CKD EPI equation.     This calculation has not been validated in all clinical situations.     eGFR's persistently <90 mL/min signify possible Chronic Kidney     Disease.  Randolm Idol, ED     Status: None   Collection Time    11/23/13  9:24 PM      Result Value Ref Range   Troponin i, poc 0.00  0.00 - 0.08 ng/mL   Comment 3            Comment: Due to the release kinetics of cTnI,     a negative result within the first hours     of the onset of symptoms does not rule out     myocardial infarction with certainty.     If myocardial infarction is still suspected,     repeat the test at appropriate intervals.  TSH     Status: None   Collection Time    11/23/13  9:25 PM      Result Value Ref Range   TSH 2.320  0.350 - 4.500 uIU/mL   Comment: Please note change in reference range.     Performed at [place] (HOSP PERFORMED)     Status: Abnormal   Collection Time    11/23/13  9:31 PM      Result Value Ref Range   Opiates NONE DETECTED  NONE DETECTED   Cocaine NONE DETECTED  NONE DETECTED   Benzodiazepines NONE DETECTED  NONE  DETECTED   Amphetamines NONE DETECTED  NONE DETECTED   Tetrahydrocannabinol POSITIVE (*) NONE DETECTED   Barbiturates NONE DETECTED  NONE DETECTED   Comment:            DRUG SCREEN FOR MEDICAL PURPOSES     ONLY.  IF CONFIRMATION IS NEEDED     FOR ANY PURPOSE, NOTIFY LAB     WITHIN 5 DAYS.                LOWEST DETECTABLE LIMITS     FOR URINE DRUG SCREEN     Drug Class       Cutoff (ng/mL)     Amphetamine      1000     Barbiturate      200     Benzodiazepine   132     Tricyclics       440     Opiates          300     Cocaine          300     THC              50  TROPONIN I     Status: None   Collection Time    11/24/13  2:55 AM      Result Value Ref Range   Troponin I <0.30  <0.30 ng/mL   Comment:            Due to the release kinetics of cTnI,     a negative result within the first hours     of the onset of symptoms does not rule out     myocardial infarction with certainty.  If myocardial infarction is still suspected,     repeat the test at appropriate intervals.  PROTIME-INR     Status: None   Collection Time    11/24/13  2:55 AM      Result Value Ref Range   Prothrombin Time 12.7  11.6 - 15.2 seconds   INR 0.97  0.00 - 1.49  CBC WITH DIFFERENTIAL     Status: None   Collection Time    11/24/13  2:55 AM      Result Value Ref Range   WBC 4.6  4.0 - 10.5 K/uL   RBC 5.55  4.22 - 5.81 MIL/uL   Hemoglobin 16.3  13.0 - 17.0 g/dL   HCT 47.4  39.0 - 52.0 %   MCV 85.4  78.0 - 100.0 fL   MCH 29.4  26.0 - 34.0 pg   MCHC 34.4  30.0 - 36.0 g/dL   RDW 12.8  11.5 - 15.5 %   Platelets 203  150 - 400 K/uL   Neutrophils Relative % 51  43 - 77 %   Neutro Abs 2.4  1.7 - 7.7 K/uL   Lymphocytes Relative 36  12 - 46 %   Lymphs Abs 1.6  0.7 - 4.0 K/uL   Monocytes Relative 8  3 - 12 %   Monocytes Absolute 0.3  0.1 - 1.0 K/uL   Eosinophils Relative 4  0 - 5 %   Eosinophils Absolute 0.2  0.0 - 0.7 K/uL   Basophils Relative 1  0 - 1 %   Basophils Absolute 0.0  0.0 - 0.1 K/uL    BASIC METABOLIC PANEL     Status: Abnormal   Collection Time    11/24/13  2:55 AM      Result Value Ref Range   Sodium 140  137 - 147 mEq/L   Potassium 4.2  3.7 - 5.3 mEq/L   Chloride 103  96 - 112 mEq/L   CO2 26  19 - 32 mEq/L   Glucose, Bld 81  70 - 99 mg/dL   BUN 8  6 - 23 mg/dL   Creatinine, Ser 1.14  0.50 - 1.35 mg/dL   Calcium 9.2  8.4 - 10.5 mg/dL   GFR calc non Af Amer 82 (*) >90 mL/min   GFR calc Af Amer >90  >90 mL/min   Comment: (NOTE)     The eGFR has been calculated using the CKD EPI equation.     This calculation has not been validated in all clinical situations.     eGFR's persistently <90 mL/min signify possible Chronic Kidney     Disease.  TROPONIN I     Status: None   Collection Time    11/24/13  7:45 AM      Result Value Ref Range   Troponin I <0.30  <0.30 ng/mL   Comment:            Due to the release kinetics of cTnI,     a negative result within the first hours     of the onset of symptoms does not rule out     myocardial infarction with certainty.     If myocardial infarction is still suspected,     repeat the test at appropriate intervals.    Disposition:      Follow-up Information   Follow up with Richardson Dopp, PA-C On 12/14/2013. (2pm)    Specialty:  Physician Assistant   Contact information:   [address]  [address] [place] [address] [phone]       Discharge Medications:    Medication List         aspirin 81 MG EC tablet  Take 1 tablet (81 mg total) by mouth daily.     metoprolol tartrate 25 MG tablet  Commonly known as:  LOPRESSOR  Take 1 tablet (25 mg total) by mouth 2 (two) times daily as needed (take if you have have palpiattions or tachycardia).         Duration of Discharge Encounter: Greater than 30 minutes including physician time.  Angelena Form PA-C 11/24/2013 10:42 AM

## 2013-11-24 NOTE — ED Notes (Signed)
[facility] here to transport pt to [facility]. 

## 2013-11-29 NOTE — ED Provider Notes (Signed)
Medical screening examination/treatment/procedure(s) were conducted as a shared visit with non-physician practitioner(s) and myself.  I personally evaluated the patient during the encounter.   EKG Interpretation   Date/Time:  Tuesday Nov 23 2013 21:03:48 EDT Ventricular Rate:  82 PR Interval:    QRS Duration: 93 QT Interval:  333 QTC Calculation: 389 R Axis:   73 Text Interpretation:  Atrial fibrillation ST elev, probable normal early  repol pattern ED PHYSICIAN INTERPRETATION AVAILABLE IN [facility]  Confirmed by TEST, Record ([id number]) on 11/25/2013 7:20:15 AM       Toy BakerAnthony T Airyonna Franklyn, MD 11/29/13 [phone]

## 2013-12-10 ENCOUNTER — Other Ambulatory Visit (HOSPITAL_COMMUNITY): Payer: BC Managed Care – PPO

## 2013-12-10 ENCOUNTER — Encounter: Payer: Self-pay | Admitting: *Deleted

## 2013-12-13 ENCOUNTER — Encounter (HOSPITAL_COMMUNITY): Payer: Self-pay | Admitting: Cardiology

## 2013-12-14 ENCOUNTER — Encounter: Payer: BC Managed Care – PPO | Admitting: Physician Assistant

## 2013-12-22 ENCOUNTER — Encounter: Payer: Self-pay | Admitting: Physician Assistant

## 2014-07-23 ENCOUNTER — Encounter (HOSPITAL_COMMUNITY): Payer: Self-pay | Admitting: Emergency Medicine

## 2014-07-23 ENCOUNTER — Emergency Department (HOSPITAL_COMMUNITY)
Admission: EM | Admit: 2014-07-23 | Discharge: 2014-07-23 | Disposition: A | Discharge status: Home / Self Care | Payer: Self-pay | Attending: Emergency Medicine | Admitting: Emergency Medicine

## 2014-07-23 DIAGNOSIS — J45909 Unspecified asthma, uncomplicated: Secondary | ICD-10-CM | POA: Insufficient documentation

## 2014-07-23 DIAGNOSIS — T7840XA Allergy, unspecified, initial encounter: Secondary | ICD-10-CM

## 2014-07-23 DIAGNOSIS — Z7982 Long term (current) use of aspirin: Secondary | ICD-10-CM | POA: Insufficient documentation

## 2014-07-23 DIAGNOSIS — L240 Irritant contact dermatitis due to detergents: Secondary | ICD-10-CM | POA: Insufficient documentation

## 2014-07-23 DIAGNOSIS — R21 Rash and other nonspecific skin eruption: Secondary | ICD-10-CM | POA: Insufficient documentation

## 2014-07-23 DIAGNOSIS — Z79899 Other long term (current) drug therapy: Secondary | ICD-10-CM | POA: Insufficient documentation

## 2014-07-23 NOTE — ED Notes (Addendum)
Pt report lip and tongue swelling and hive like rash over the past several days. He reports using new beads for freshening laundry. He reports swelling resolves after taking benadryl. NAD. Lung sounds clear. No tongue swelling presently

## 2014-07-23 NOTE — ED Provider Notes (Signed)
CSN: [id number]     Arrival date & time 07/23/14  1951 History  This chart was scribed for non-physician practitioner Emilia Beck, PA-C working with Gerhard Munch, MD, by Modena Jansky, ED Scribe. This patient was seen in room WTR9/WTR9 and the patient's care was started at 8:43 PM.   Chief Complaint  Patient presents with  . Oral Swelling   Patient is a 34 y.o. male presenting with allergic reaction. The history is provided by the patient. No language interpreter was used.  Allergic Reaction Presenting symptoms: rash and swelling   Presenting symptoms: no difficulty breathing   Severity:  Moderate Prior allergic episodes:  No prior episodes Context: new detergents/soaps   Relieved by:  Antihistamines Worsened by:  Nothing tried  HPI Comments: Charles Hoffman is a 34 y.o. male who presents to the Emergency Department complaining of an allergic reaction that started a few days ago. He reports that the oral swelling has been present but got worse this morning. He states that he has swelling in his tongue, lips, and his right flank. He states that he has been using a new laundry product. He reports that he took benadryl PTA with relief. He denies any rash or SOB.   Past Medical History  Diagnosis Date  . Asthma    Past Surgical History  Procedure Laterality Date  . Thumb arthroscopy     Family History  Problem Relation Age of Onset  . Diabetes Other    History  Substance Use Topics  . Smoking status: Never Smoker   . Smokeless tobacco: Not on file  . Alcohol Use: Yes     Comment: occasional    Review of Systems  HENT: Positive for facial swelling.   Respiratory: Negative for shortness of breath.   Skin: Positive for rash.  All other systems reviewed and are negative.   Allergies  Review of patient's allergies indicates no known allergies.  Home Medications   Prior to Admission medications   Medication Sig Start Date End Date Taking? Authorizing Provider  aspirin  EC 81 MG EC tablet Take 1 tablet (81 mg total) by mouth daily. 11/24/13   Abelino Derrick, PA-C  metoprolol tartrate (LOPRESSOR) 25 MG tablet Take 1 tablet (25 mg total) by mouth 2 (two) times daily as needed (take if you have have palpiattions or tachycardia). 11/24/13   Luke K Kilroy, PA-C   BP 116/70 mmHg  Pulse 80  Temp(Src) 97.7 F (36.5 C) (Oral)  Resp 18  SpO2 99% Physical Exam  Constitutional: He is oriented to person, place, and time. He appears well-developed and well-nourished. No distress.  HENT:  Head: Normocephalic and atraumatic.  Mouth/Throat: Oropharynx is clear and moist.  No oral or facial swelling.   Eyes: EOM are normal.  Neck: Neck supple. No tracheal deviation present.  Cardiovascular: Normal rate.   Pulmonary/Chest: Effort normal. No respiratory distress. He has no wheezes.  Abdominal: Soft.  Musculoskeletal: Normal range of motion.  Neurological: He is alert and oriented to person, place, and time.  Skin: Skin is warm and dry. No rash noted.  No rash noted.   Psychiatric: He has a normal mood and affect. His behavior is normal.  Nursing note and vitals reviewed.   ED Course  Procedures (including critical care time) DIAGNOSTIC STUDIES: Oxygen Saturation is 99% on RA, normal by my interpretation.    COORDINATION OF CARE: 8:47 PM- Pt advised of plan for treatment and pt agrees.  Labs Review Labs Reviewed - No  data to display  Imaging Review No results found.   EKG Interpretation None      MDM   Final diagnoses:  Allergic reaction, initial encounter    8:59 PM Patient likely having intermittent allergic reactions to new laundry freshener. No other new products noted. Vitals stable and patient afebrile. No signs of facial swelling, SOB, or rash at this time. Patient instructed to continue taking benadryl as needed, discard the laundry freshener, and rewash all clothes and sheets. Patient instructed to return with worsening or concerning symptoms.    I personally performed the services described in this documentation, which was scribed in my presence. The recorded information has been reviewed and is accurate.      Emilia BeckKaitlyn Beulah Capobianco, PA-C 07/23/14 2101  Gerhard Munchobert Lockwood, MD 07/23/14 2245

## 2014-07-23 NOTE — Discharge Instructions (Signed)
Continue to take benadryl as needed for rash or swelling. Return to the ED with worsening or concerning symptoms such as tongue swelling or shortness of breath. Refer to attached documents for more information.

## 2017-01-01 ENCOUNTER — Encounter (HOSPITAL_COMMUNITY): Payer: Self-pay | Admitting: Emergency Medicine

## 2017-01-01 ENCOUNTER — Ambulatory Visit (HOSPITAL_COMMUNITY)
Admission: EM | Admit: 2017-01-01 | Discharge: 2017-01-01 | Disposition: A | Discharge status: Home / Self Care | Payer: Self-pay | Attending: Family Medicine | Admitting: Family Medicine

## 2017-01-01 DIAGNOSIS — S0501XA Injury of conjunctiva and corneal abrasion without foreign body, right eye, initial encounter: Secondary | ICD-10-CM

## 2017-01-01 MED ORDER — TOBRAMYCIN 0.3 % OP SOLN: 1.0000 [drp] | OPHTHALMIC | 0 refills | Status: AC

## 2017-01-01 NOTE — ED Triage Notes (Signed)
[facility] a complaint of itching and burning to the right eye that started when he woke up today.

## 2017-01-01 NOTE — ED Provider Notes (Signed)
[facility]    CSN: [id number] Arrival date & time: 01/01/17  1524     History   Chief Complaint Chief Complaint  Patient presents with  . Eye Problem    HPI Charles BentJason Hoffman is a 36 y.o. male.   [facility] with a complaint of itching and burning to the right eye that started when he woke up today. He says that the eye feels like there is a foreign body trapped. He does not move and seems like it's under the upper lid.      Past Medical History:  Diagnosis Date  . Asthma     Patient Active Problem List   Diagnosis Date Noted  . Morbid obesity- BMI 41 11/24/2013  . PAF (paroxysmal atrial fibrillation) (HCC) 11/23/2013  . INSOMNIA-SLEEP DISORDER-UNSPEC 01/12/2010  . Suspected sleep apnea 01/12/2010    Past Surgical History:  Procedure Laterality Date  . THUMB ARTHROSCOPY         Home Medications    Prior to Admission medications   Medication Sig Start Date End Date Taking? Authorizing Provider  tobramycin (TOBREX) 0.3 % ophthalmic solution Place 1 drop into the right eye every 4 (four) hours. 01/01/17   Elvina SidleLauenstein, Ramello Cordial, MD    Family History Family History  Problem Relation Age of Onset  . Diabetes Other     Social History Social History  Substance Use Topics  . Smoking status: Never Smoker  . Smokeless tobacco: Not on file  . Alcohol use Yes     Comment: occasional     Allergies   Patient has no known allergies.   Review of Systems Review of Systems  Eyes: Positive for pain and redness.  All other systems reviewed and are negative.    Physical Exam Triage Vital Signs ED Triage Vitals  Enc Vitals Group     BP 01/01/17 1531 130/76     Pulse Rate 01/01/17 1531 85     Resp 01/01/17 1531 16     Temp 01/01/17 1531 98.5 F (36.9 C)     Temp Source 01/01/17 1531 Oral     SpO2 01/01/17 1531 97 %     Weight --      Height --      Head Circumference --      Peak Flow --      Pain Score 01/01/17 1530 8   Pain Loc --      Pain Edu? --      Excl. in GC? --    No data found.   Updated Vital Signs BP 130/76 (BP Location: Right Arm)   Pulse 85   Temp 98.5 F (36.9 C) (Oral)   Resp 16   SpO2 97%    Physical Exam  Constitutional: He is oriented to person, place, and time. He appears well-developed and well-nourished.  Eyes: Pupils are equal, round, and reactive to light.  Injected right eye Lid everted on right and no foreign body seen I flushed with saline I stained with flouriscein and small abrasions seen right laterally  Neck: Normal range of motion. Neck supple.  Pulmonary/Chest: Effort normal.  Musculoskeletal: Normal range of motion.  Neurological: He is alert and oriented to person, place, and time.  Skin: Skin is warm and dry.  Nursing note and vitals reviewed.    UC Treatments / Results  Labs (all labs ordered are listed, but only abnormal results are displayed) Labs Reviewed - No data to display  EKG  EKG Interpretation None       Radiology No results found.  Procedures Procedures (including critical care time)  Medications Ordered in UC Medications - No data to display   Initial Impression / Assessment and Plan / UC Course  I have reviewed the triage vital signs and the nursing notes.  Pertinent labs & imaging results that were available during my care of the patient were reviewed by me and considered in my medical decision making (see chart for details).     Final Clinical Impressions(s) / UC Diagnoses   Final diagnoses:  Abrasion of right cornea, initial encounter    New Prescriptions New Prescriptions   TOBRAMYCIN (TOBREX) 0.3 % OPHTHALMIC SOLUTION    Place 1 drop into the right eye every 4 (four) hours.     Elvina Sidle, MD 01/01/17 1553
